# Patient Record
Sex: Female | Born: 1980 | Race: Black or African American | Hispanic: No | Marital: Single | Smoking: Former smoker
Health system: Southern US, Community
[De-identification: ages and names within clinical notes are randomized; demographics above are authoritative.]

## PROBLEM LIST (undated history)

## (undated) ENCOUNTER — Inpatient Hospital Stay (HOSPITAL_COMMUNITY): Payer: Self-pay

## (undated) DIAGNOSIS — Z789 Other specified health status: Secondary | ICD-10-CM

## (undated) DIAGNOSIS — F419 Anxiety disorder, unspecified: Secondary | ICD-10-CM

## (undated) DIAGNOSIS — B9689 Other specified bacterial agents as the cause of diseases classified elsewhere: Secondary | ICD-10-CM

## (undated) DIAGNOSIS — N760 Acute vaginitis: Secondary | ICD-10-CM

## (undated) DIAGNOSIS — Z3490 Encounter for supervision of normal pregnancy, unspecified, unspecified trimester: Secondary | ICD-10-CM

## (undated) HISTORY — PX: CHOLECYSTECTOMY: SHX55

## (undated) HISTORY — PX: NO PAST SURGERIES: SHX2092

---

## 2000-09-13 ENCOUNTER — Encounter: Payer: Self-pay | Admitting: Emergency Medicine

## 2000-09-13 ENCOUNTER — Emergency Department (HOSPITAL_COMMUNITY): Admission: EM | Admit: 2000-09-13 | Discharge: 2000-09-14 | Payer: Self-pay | Admitting: Emergency Medicine

## 2012-07-15 ENCOUNTER — Emergency Department (HOSPITAL_BASED_OUTPATIENT_CLINIC_OR_DEPARTMENT_OTHER): Payer: Self-pay

## 2012-07-15 ENCOUNTER — Emergency Department (HOSPITAL_BASED_OUTPATIENT_CLINIC_OR_DEPARTMENT_OTHER)
Admission: EM | Admit: 2012-07-15 | Discharge: 2012-07-15 | Disposition: A | Discharge status: Home / Self Care | Payer: Self-pay | Attending: Emergency Medicine | Admitting: Emergency Medicine

## 2012-07-15 ENCOUNTER — Encounter (HOSPITAL_BASED_OUTPATIENT_CLINIC_OR_DEPARTMENT_OTHER): Payer: Self-pay | Admitting: Emergency Medicine

## 2012-07-15 DIAGNOSIS — O200 Threatened abortion: Secondary | ICD-10-CM | POA: Insufficient documentation

## 2012-07-15 LAB — CBC
HCT: 32.9 % — ABNORMAL LOW (ref 36.0–46.0)
Hemoglobin: 11.3 g/dL — ABNORMAL LOW (ref 12.0–15.0)
MCH: 31 pg (ref 26.0–34.0)
MCHC: 34.3 g/dL (ref 30.0–36.0)
MCV: 90.4 fL (ref 78.0–100.0)
Platelets: 224 10*3/uL (ref 150–400)
RBC: 3.64 MIL/uL — ABNORMAL LOW (ref 3.87–5.11)
RDW: 11.7 % (ref 11.5–15.5)
WBC: 4.7 10*3/uL (ref 4.0–10.5)

## 2012-07-15 LAB — URINALYSIS, ROUTINE W REFLEX MICROSCOPIC
Hgb urine dipstick: NEGATIVE
Nitrite: NEGATIVE
Protein, ur: NEGATIVE mg/dL
Specific Gravity, Urine: 1.007 (ref 1.005–1.030)
Urobilinogen, UA: 0.2 mg/dL (ref 0.0–1.0)

## 2012-07-15 LAB — BASIC METABOLIC PANEL
Calcium: 9.5 mg/dL (ref 8.4–10.5)
GFR calc Af Amer: >90 mL/min (ref >90)
GFR calc non Af Amer: >90 mL/min (ref >90)
Glucose, Bld: 98 mg/dL (ref 70–99)
Sodium: 138 mEq/L (ref 135–145)

## 2012-07-15 LAB — PREGNANCY, URINE: Preg Test, Ur: POSITIVE — AB

## 2012-07-15 LAB — HCG, QUANTITATIVE, PREGNANCY: hCG, Beta Chain, Quant, S: 486 m[IU]/mL — ABNORMAL HIGH (ref <5)

## 2012-07-15 MED ORDER — RHO D IMMUNE GLOBULIN 1500 UNIT/2ML IJ SOLN
300.0000 ug | Freq: Once | INTRAMUSCULAR | Status: AC
  Administered 2012-07-15: 300 ug via INTRAMUSCULAR

## 2012-07-15 MED ORDER — RHO D IMMUNE GLOBULIN 1500 UNIT/2ML IJ SOLN
INTRAMUSCULAR | Status: AC
  Administered 2012-07-15: 300 ug via INTRAMUSCULAR
  Filled 2012-07-15: qty 2

## 2012-07-15 NOTE — ED Notes (Signed)
I placed a call for consult to GYN teaching service at [facility] per [place], NP, the call was connected to [facility] directly.

## 2012-07-15 NOTE — ED Provider Notes (Signed)
History/physical exam/procedure(s) were performed by non-physician practitioner and as supervising physician I was immediately available for consultation/collaboration. I have reviewed all notes and am in agreement with care and plan.   Eragon Hammond S Edynn Gillock, MD 07/15/12 2342 

## 2012-07-15 NOTE — ED Provider Notes (Signed)
History     CSN: [id number]  Arrival date & time 07/15/12  4098   First MD Initiated Contact with Patient 07/15/12 1842      Chief Complaint  Patient presents with  . Vaginal Bleeding    (Consider location/radiation/quality/duration/timing/severity/associated sxs/prior treatment) HPI Comments: Pt had a positive pregnancy test [facility] the [facility] this week:pt g2 par1  Patient is a 31 y.o. female presenting with vaginal bleeding. The history is provided by the patient. No language interpreter was used.  Vaginal Bleeding This is a new problem. The current episode started yesterday. The problem occurs constantly. The problem has been unchanged. Associated symptoms include abdominal pain. Pertinent negatives include no nausea or vomiting. Nothing aggravates the symptoms. She has tried nothing for the symptoms.    No past medical history on file.  No past surgical history on file.  No family history on file.  History  Substance Use Topics  . Smoking status: Not on file  . Smokeless tobacco: Not on file  . Alcohol Use: Not on file    OB History    Grav Para Term Preterm Abortions TAB SAB Ect Mult Living                  Review of Systems  Constitutional: Negative.   Respiratory: Negative.   Cardiovascular: Negative.   Gastrointestinal: Positive for abdominal pain. Negative for nausea and vomiting.  Genitourinary: Positive for vaginal bleeding.    Allergies  Review of patient's allergies indicates no known allergies.  Home Medications   Current Outpatient Rx  Name Route Sig Dispense Refill  . CEPHALEXIN 500 MG PO CAPS Oral Take 500 mg by mouth 4 (four) times daily.    Marland Kitchen METRONIDAZOLE 500 MG PO TABS Oral Take 500 mg by mouth 2 (two) times daily.    Marland Kitchen PRENATAL MULTIVITAMIN CH Oral Take 1 tablet by mouth daily.      BP 120/66  Pulse 110  Temp 98.9 F (37.2 C) (Oral)  Resp 16  SpO2 100%  LMP 07/01/2012  Physical Exam  Nursing note and vitals  reviewed. Constitutional: She is oriented to person, place, and time. She appears well-developed and well-nourished.  HENT:  Head: Normocephalic and atraumatic.  Neck: Neck supple.  Cardiovascular: Normal rate and regular rhythm.   Pulmonary/Chest: Effort normal and breath sounds normal.  Abdominal: Soft. Bowel sounds are normal. There is tenderness in the left lower quadrant.  Genitourinary:       Brown vaginal discharge:os is closed  Musculoskeletal: Normal range of motion.  Neurological: She is alert and oriented to person, place, and time.  Skin: Skin is warm and dry.    ED Course  Procedures (including critical care time)  Labs Reviewed  HCG, QUANTITATIVE, PREGNANCY - Abnormal; Notable for the following:    hCG, Beta Chain, Quant, S 486 (*)     All other components within normal limits  CBC - Abnormal; Notable for the following:    RBC 3.64 (*)     Hemoglobin 11.3 (*)     HCT 32.9 (*)     All other components within normal limits  PREGNANCY, URINE - Abnormal; Notable for the following:    Preg Test, Ur POSITIVE (*)     All other components within normal limits  ABO/RH  URINALYSIS, ROUTINE W REFLEX MICROSCOPIC  BASIC METABOLIC PANEL   US Ob Comp Less 14 Wks  07/15/2012  *RADIOLOGY REPORT*  Clinical Data: Vaginal bleeding.  Positive pregnancy test.  OBSTETRIC <14  WK [place] AND TRANSVAGINAL OB US  Technique:  Both transabdominal and transvaginal ultrasound examinations were performed for complete evaluation of the gestation as well as the maternal uterus, adnexal regions, and pelvic cul-de-sac.  Transvaginal technique was performed to assess early pregnancy.  Comparison:  None.  Intrauterine gestational sac:  None. Yolk sac: Not present. Embryo: Not present. Cardiac Activity: Not present. Heart Rate: Not applicable bpm  Maternal uterus/adnexae: Right ovarian cyst is present measuring 33 mm x 21 mm x 25 mm. This appears to be located within the ovary and ovarian cyst is favored over a  paraovarian cyst.  Left ovary appears normal.  No adnexal mass.  Small amount of free fluid is present in the anatomic pelvis which may be physiologic.  IMPRESSION: There is no intrauterine pregnancy identified.  The differential considerations include intrauterine gestation too early to be visualized, spontaneous abortion or ectopic.  Consider follow-up ultrasound in 14 days and serial quantitative beta HCGs.  Original Report Authenticated By: Andreas Newport, M.D.   US Ob Transvaginal  07/15/2012  *RADIOLOGY REPORT*  Clinical Data: Vaginal bleeding.  Positive pregnancy test.  OBSTETRIC <14 WK [place] AND TRANSVAGINAL OB US  Technique:  Both transabdominal and transvaginal ultrasound examinations were performed for complete evaluation of the gestation as well as the maternal uterus, adnexal regions, and pelvic cul-de-sac.  Transvaginal technique was performed to assess early pregnancy.  Comparison:  None.  Intrauterine gestational sac:  None. Yolk sac: Not present. Embryo: Not present. Cardiac Activity: Not present. Heart Rate: Not applicable bpm  Maternal uterus/adnexae: Right ovarian cyst is present measuring 33 mm x 21 mm x 25 mm. This appears to be located within the ovary and ovarian cyst is favored over a paraovarian cyst.  Left ovary appears normal.  No adnexal mass.  Small amount of free fluid is present in the anatomic pelvis which may be physiologic.  IMPRESSION: There is no intrauterine pregnancy identified.  The differential considerations include intrauterine gestation too early to be visualized, spontaneous abortion or ectopic.  Consider follow-up ultrasound in 14 days and serial quantitative beta HCGs.  Original Report Authenticated By: Andreas Newport, M.D.     1. Threatened miscarriage       MDM  Pt given rhogam as spoke with [facility] and because lab was unsure of typing related to the anti d antigen they said to give rhogam:discussed findings with pt and that to early to say what is going  to happen and pt is to go follow up in the [place] in 2 day        Teressa Lower, NP 07/15/12 2303

## 2012-07-15 NOTE — ED Notes (Signed)
Pt states she is pregnant but is now having some vaginal bleeding.

## 2012-07-17 LAB — ABO/RH
Antibody Screen: NEGATIVE
Weak D: POSITIVE

## 2013-08-14 ENCOUNTER — Encounter (HOSPITAL_BASED_OUTPATIENT_CLINIC_OR_DEPARTMENT_OTHER): Payer: Self-pay | Admitting: Emergency Medicine

## 2013-08-14 ENCOUNTER — Emergency Department (HOSPITAL_BASED_OUTPATIENT_CLINIC_OR_DEPARTMENT_OTHER)
Admission: EM | Admit: 2013-08-14 | Discharge: 2013-08-14 | Disposition: A | Discharge status: Home / Self Care | Payer: Self-pay | Attending: Emergency Medicine | Admitting: Emergency Medicine

## 2013-08-14 DIAGNOSIS — N390 Urinary tract infection, site not specified: Secondary | ICD-10-CM | POA: Insufficient documentation

## 2013-08-14 DIAGNOSIS — N760 Acute vaginitis: Secondary | ICD-10-CM | POA: Insufficient documentation

## 2013-08-14 DIAGNOSIS — F17200 Nicotine dependence, unspecified, uncomplicated: Secondary | ICD-10-CM | POA: Insufficient documentation

## 2013-08-14 DIAGNOSIS — Z3202 Encounter for pregnancy test, result negative: Secondary | ICD-10-CM | POA: Insufficient documentation

## 2013-08-14 DIAGNOSIS — B9689 Other specified bacterial agents as the cause of diseases classified elsewhere: Secondary | ICD-10-CM

## 2013-08-14 LAB — URINALYSIS, ROUTINE W REFLEX MICROSCOPIC
Glucose, UA: NEGATIVE mg/dL
Hgb urine dipstick: NEGATIVE
Ketones, ur: NEGATIVE mg/dL
Protein, ur: NEGATIVE mg/dL
Urobilinogen, UA: 1 mg/dL (ref 0.0–1.0)

## 2013-08-14 LAB — WET PREP, GENITAL: Trich, Wet Prep: NONE SEEN

## 2013-08-14 LAB — URINE MICROSCOPIC-ADD ON

## 2013-08-14 MED ORDER — METRONIDAZOLE 500 MG PO TABS: 500.0000 mg | ORAL_TABLET | Freq: Two times a day (BID) | ORAL | Status: DC

## 2013-08-14 MED ORDER — CEPHALEXIN 500 MG PO CAPS: 500.0000 mg | ORAL_CAPSULE | Freq: Four times a day (QID) | ORAL | Status: DC

## 2013-08-14 NOTE — ED Notes (Signed)
Pt reports lower abdominal pain for 2 weeks, started having pain in lower back today, + frequent urination, foul smelling urine and vaginal discharge

## 2013-08-14 NOTE — ED Provider Notes (Signed)
CSN: [id number]     Arrival date & time 08/14/13  0019 History   First MD Initiated Contact with Patient 08/14/13 0030     Chief Complaint  Patient presents with  . Abdominal Pain   (Consider location/radiation/quality/duration/timing/severity/associated sxs/prior Treatment) HPI Comments: 32 year old female presents with abdominal pain, frequent urination, dysuria and vaginal discharge. She states she's had a foul vaginal odor for about one month. The abdominal pain has been intermittent cramping and sharp pain for about 2 weeks. She's had BV before and states that her discharge is similar to this. She does not think she has an STD this time. She just got off birth control and does not think she's pregnant. She is not currently having any pain. She states that she's not have any itching or vaginal bleeding. Denies any fevers  The history is provided by the patient.    History reviewed. No pertinent past medical history. History reviewed. No pertinent past surgical history. History reviewed. No pertinent family history. History  Substance Use Topics  . Smoking status: Current Some Day Smoker    Types: Cigarettes  . Smokeless tobacco: Not on file  . Alcohol Use: Yes     Comment: ocassionally   OB History   Grav Para Term Preterm Abortions TAB SAB Ect Mult Living                 Review of Systems  Constitutional: Negative for fever and chills.  Gastrointestinal: Positive for abdominal pain. Negative for vomiting.  Genitourinary: Positive for dysuria, frequency and vaginal discharge. Negative for vaginal pain and menstrual problem.  Musculoskeletal: Positive for back pain.  All other systems reviewed and are negative.    Allergies  Review of patient's allergies indicates no known allergies.  Home Medications   Current Outpatient Rx  Name  Route  Sig  Dispense  Refill  . cephALEXin (KEFLEX) 500 MG capsule   Oral   Take 500 mg by mouth 4 (four) times daily.         .  metroNIDAZOLE (FLAGYL) 500 MG tablet   Oral   Take 500 mg by mouth 2 (two) times daily.         . Prenatal Vit-Fe Fumarate-FA (PRENATAL MULTIVITAMIN) TABS   Oral   Take 1 tablet by mouth daily.          BP 131/84  Pulse 75  Temp(Src) 98.6 F (37 C) (Oral)  Resp 18  Ht 5\' 5"  (1.651 m)  Wt 140 lb (63.504 kg)  BMI 23.3 kg/m2  SpO2 100% Physical Exam  Nursing note and vitals reviewed. Constitutional: She is oriented to person, place, and time. She appears well-developed and well-nourished.  HENT:  Head: Normocephalic and atraumatic.  Right Ear: External ear normal.  Left Ear: External ear normal.  Nose: Nose normal.  Eyes: Right eye exhibits no discharge. Left eye exhibits no discharge.  Cardiovascular: Normal rate, regular rhythm and normal heart sounds.   Pulmonary/Chest: Effort normal and breath sounds normal.  Abdominal: Soft. She exhibits no distension. There is no tenderness. There is no CVA tenderness.  Genitourinary: Uterus normal. Uterus is not enlarged and not tender. Cervix exhibits discharge. Cervix exhibits no motion tenderness and no friability. Right adnexum displays no tenderness. Left adnexum displays no tenderness. No tenderness or bleeding around the vagina. Vaginal discharge found.  Neurological: She is alert and oriented to person, place, and time.  Skin: Skin is warm and dry.    ED Course  Procedures (including critical  care time) Labs Review Labs Reviewed  WET PREP, GENITAL - Abnormal; Notable for the following:    Clue Cells Wet Prep HPF POC MANY (*)    WBC, Wet Prep HPF POC MODERATE (*)    All other components within normal limits  URINALYSIS, ROUTINE W REFLEX MICROSCOPIC - Abnormal; Notable for the following:    APPearance CLOUDY (*)    Leukocytes, UA SMALL (*)    All other components within normal limits  URINE MICROSCOPIC-ADD ON - Abnormal; Notable for the following:    Squamous Epithelial / LPF MANY (*)    Bacteria, UA MANY (*)    All  other components within normal limits  GC/CHLAMYDIA PROBE AMP  URINE CULTURE  PREGNANCY, URINE   Imaging Review No results found.  MDM   1. UTI (urinary tract infection)   2. BV (bacterial vaginosis)    Patient is well appearing, and has a benign abdominal exam. No tenderness noted. Doubt any acute surgical process. Given her symptoms and the wet prep I will treat for BV. Her urine is dirty catch but given her symptoms will treat her for UTI as well. Does not appear systemically ill and is stable for outpatient treatment.    Audree Camel, MD 08/14/13 [phone]

## 2013-08-15 LAB — URINE CULTURE

## 2014-04-15 ENCOUNTER — Emergency Department (HOSPITAL_BASED_OUTPATIENT_CLINIC_OR_DEPARTMENT_OTHER)
Admission: EM | Admit: 2014-04-15 | Discharge: 2014-04-15 | Disposition: A | Discharge status: Home / Self Care | Payer: Medicaid Other | Attending: Emergency Medicine | Admitting: Emergency Medicine

## 2014-04-15 ENCOUNTER — Encounter (HOSPITAL_BASED_OUTPATIENT_CLINIC_OR_DEPARTMENT_OTHER): Payer: Self-pay | Admitting: Emergency Medicine

## 2014-04-15 DIAGNOSIS — A5901 Trichomonal vulvovaginitis: Secondary | ICD-10-CM | POA: Insufficient documentation

## 2014-04-15 DIAGNOSIS — F17200 Nicotine dependence, unspecified, uncomplicated: Secondary | ICD-10-CM | POA: Insufficient documentation

## 2014-04-15 DIAGNOSIS — B9689 Other specified bacterial agents as the cause of diseases classified elsewhere: Secondary | ICD-10-CM | POA: Insufficient documentation

## 2014-04-15 DIAGNOSIS — Z792 Long term (current) use of antibiotics: Secondary | ICD-10-CM | POA: Insufficient documentation

## 2014-04-15 DIAGNOSIS — A599 Trichomoniasis, unspecified: Secondary | ICD-10-CM

## 2014-04-15 DIAGNOSIS — A499 Bacterial infection, unspecified: Secondary | ICD-10-CM | POA: Insufficient documentation

## 2014-04-15 DIAGNOSIS — N760 Acute vaginitis: Secondary | ICD-10-CM | POA: Insufficient documentation

## 2014-04-15 DIAGNOSIS — Z3202 Encounter for pregnancy test, result negative: Secondary | ICD-10-CM | POA: Insufficient documentation

## 2014-04-15 DIAGNOSIS — Z79899 Other long term (current) drug therapy: Secondary | ICD-10-CM | POA: Insufficient documentation

## 2014-04-15 LAB — URINALYSIS, ROUTINE W REFLEX MICROSCOPIC
BILIRUBIN URINE: NEGATIVE
GLUCOSE, UA: NEGATIVE mg/dL
HGB URINE DIPSTICK: NEGATIVE
KETONES UR: NEGATIVE mg/dL
Nitrite: NEGATIVE
PH: 6 (ref 5.0–8.0)
PROTEIN: NEGATIVE mg/dL
Specific Gravity, Urine: 1.02 (ref 1.005–1.030)
Urobilinogen, UA: 1 mg/dL (ref 0.0–1.0)

## 2014-04-15 LAB — URINE MICROSCOPIC-ADD ON

## 2014-04-15 LAB — PREGNANCY, URINE: Preg Test, Ur: NEGATIVE

## 2014-04-15 LAB — WET PREP, GENITAL: Yeast Wet Prep HPF POC: NONE SEEN

## 2014-04-15 MED ORDER — METRONIDAZOLE 500 MG PO TABS: 500.0000 mg | ORAL_TABLET | Freq: Two times a day (BID) | ORAL | Status: DC

## 2014-04-15 MED ORDER — AZITHROMYCIN 250 MG PO TABS
1000.0000 mg | ORAL_TABLET | Freq: Once | ORAL | Status: AC
  Administered 2014-04-15: 1000 mg via ORAL
  Filled 2014-04-15: qty 4

## 2014-04-15 MED ORDER — CEFTRIAXONE SODIUM 250 MG IJ SOLR
250.0000 mg | Freq: Once | INTRAMUSCULAR | Status: AC
  Administered 2014-04-15: 250 mg via INTRAMUSCULAR
  Filled 2014-04-15: qty 250

## 2014-04-15 NOTE — ED Notes (Signed)
Pt stated having abdominal pain and vaginal discharge with odor x1week

## 2014-04-15 NOTE — Discharge Instructions (Signed)
Flagyl as prescribed.  We will call you if your cultures indicate you require further treatment.  Return to the emergency department if you develop worsening or new and concerning symptoms.   Bacterial Vaginosis Bacterial vaginosis is a vaginal infection that occurs when the normal balance of bacteria in the vagina is disrupted. It results from an overgrowth of certain bacteria. This is the most common vaginal infection in women of childbearing age. Treatment is important to prevent complications, especially in pregnant women, as it can cause a premature delivery. CAUSES  Bacterial vaginosis is caused by an increase in harmful bacteria that are normally present in smaller amounts in the vagina. Several different kinds of bacteria can cause bacterial vaginosis. However, the reason that the condition develops is not fully understood. RISK FACTORS Certain activities or behaviors can put you at an increased risk of developing bacterial vaginosis, including:  Having a new sex partner or multiple sex partners.  Douching.  Using an intrauterine device (IUD) for contraception. Women do not get bacterial vaginosis from toilet seats, bedding, swimming pools, or contact with objects around them. SIGNS AND SYMPTOMS  Some women with bacterial vaginosis have no signs or symptoms. Common symptoms include:  Grey vaginal discharge.  A fishlike odor with discharge, especially after sexual intercourse.  Itching or burning of the vagina and vulva.  Burning or pain with urination. DIAGNOSIS  Your health care provider will take a medical history and examine the vagina for signs of bacterial vaginosis. A sample of vaginal fluid may be taken. Your health care provider will look at this sample under a microscope to check for bacteria and abnormal cells. A vaginal pH test may also be done.  TREATMENT  Bacterial vaginosis may be treated with antibiotic medicines. These may be given in the form of a pill or a  vaginal cream. A second round of antibiotics may be prescribed if the condition comes back after treatment.  HOME CARE INSTRUCTIONS   Only take over-the-counter or prescription medicines as directed by your health care provider.  If antibiotic medicine was prescribed, take it as directed. Make sure you finish it even if you start to feel better.  Do not have sex until treatment is completed.  Tell all sexual partners that you have a vaginal infection. They should see their health care provider and be treated if they have problems, such as a mild rash or itching.  Practice safe sex by using condoms and only having one sex partner. SEEK MEDICAL CARE IF:   Your symptoms are not improving after 3 days of treatment.  You have increased discharge or pain.  You have a fever. MAKE SURE YOU:   Understand these instructions.  Will watch your condition.  Will get help right away if you are not doing well or get worse. FOR MORE INFORMATION  Centers for Disease Control and Prevention, Division of STD Prevention: SolutionApps.co.zawww.cdc.gov/std American Sexual Health Association (ASHA): www.ashastd.org  Document Released: 11/28/2005 Document Revised: 09/18/2013 Document Reviewed: 07/10/2013 [facility]Exit[facility] Patient Information 2014 [place]Care, [place].  Trichomoniasis Trichomoniasis is an infection, caused by the Trichomonas organism, that affects both women and men. In women, the outer female genitalia and the vagina are affected. In men, the penis is mainly affected, but the prostate and other reproductive organs can also be involved. Trichomoniasis is a sexually transmitted disease (STD) and is most often passed to another person through sexual contact. The majority of people who get trichomoniasis do so from a sexual encounter and are also at  risk for other STDs. CAUSES   Sexual intercourse with an infected partner.  It can be present in swimming pools or hot tubs. SYMPTOMS   Abnormal gray-green frothy vaginal  discharge in women.  Vaginal itching and irritation in women.  Itching and irritation of the area outside the vagina in women.  Penile discharge with or without pain in males.  Inflammation of the urethra (urethritis), causing painful urination.  Bleeding after sexual intercourse. RELATED COMPLICATIONS  Pelvic inflammatory disease.  Infection of the uterus (endometritis).  Infertility.  Tubal (ectopic) pregnancy.  It can be associated with other STDs, including gonorrhea and chlamydia, hepatitis B, and HIV. COMPLICATIONS DURING PREGNANCY  Early (premature) delivery.  Premature rupture of the membranes (PROM).  Low birth weight. DIAGNOSIS   Visualization of Trichomonas under the microscope from the vagina discharge.  Ph of the vagina greater than 4.5, tested with a test tape.  Trich Rapid Test.  Culture of the organism, but this is not usually needed.  It may be found on a Pap test.  Having a "strawberry cervix,"which means the cervix looks very red like a strawberry. TREATMENT   You may be given medication to fight the infection. Inform your caregiver if you could be or are pregnant. Some medications used to treat the infection should not be taken during pregnancy.  Over-the-counter medications or creams to decrease itching or irritation may be recommended.  Your sexual partner will need to be treated if infected. HOME CARE INSTRUCTIONS   Take all medication prescribed by your caregiver.  Take over-the-counter medication for itching or irritation as directed by your caregiver.  Do not have sexual intercourse while you have the infection.  Do not douche or wear tampons.  Discuss your infection with your partner, as your partner may have acquired the infection from you. Or, your partner may have been the person who transmitted the infection to you.  Have your sex partner examined and treated if necessary.  Practice safe, informed, and protected sex.  See  your caregiver for other STD testing. SEEK MEDICAL CARE IF:   You still have symptoms after you finish the medication.  You have an oral temperature above 102 F (38.9 C).  You develop belly (abdominal) pain.  You have pain when you urinate.  You have bleeding after sexual intercourse.  You develop a rash.  The medication makes you sick or makes you throw up (vomit). Document Released: 05/24/2001 Document Revised: 02/20/2012 Document Reviewed: 06/19/2009 [facility] DivisionExitCare Patient Information 2014 [place]Care, [place]LL[place].

## 2014-04-15 NOTE — ED Provider Notes (Signed)
CSN: [id number]     Arrival date & time 04/15/14  1054 History   First MD Initiated Contact with Patient 04/15/14 1147     Chief Complaint  Patient presents with  . Abdominal Pain  . Vaginal Discharge     (Consider location/radiation/quality/duration/timing/severity/associated sxs/prior Treatment) HPI Comments: Patient is a 33 year old female who presents with complaints of vaginal discharge and suprapubic discomfort. This is been going on for approximately one week and worsening. She states that she found out her boyfriend has been having relations with someone else. She just wants to be "checked out". She is uncertain of her last menstrual period as she gets a Depo shot.  Patient is a 33 y.o. female presenting with abdominal pain and vaginal discharge. The history is provided by the patient.  Abdominal Pain Pain location:  Suprapubic Pain radiates to:  Does not radiate Pain severity:  Mild Onset quality:  Gradual Duration:  1 week Timing:  Constant Progression:  Worsening Chronicity:  New Associated symptoms: vaginal discharge   Vaginal Discharge Associated symptoms: abdominal pain     History reviewed. No pertinent past medical history. History reviewed. No pertinent past surgical history. No family history on file. History  Substance Use Topics  . Smoking status: Current Some Day Smoker    Types: Cigarettes  . Smokeless tobacco: Not on file  . Alcohol Use: Yes     Comment: ocassionally   OB History   Grav Para Term Preterm Abortions TAB SAB Ect Mult Living                 Review of Systems  Gastrointestinal: Positive for abdominal pain.  Genitourinary: Positive for vaginal discharge.  All other systems reviewed and are negative.     Allergies  Review of patient's allergies indicates no known allergies.  Home Medications   Prior to Admission medications   Medication Sig Start Date End Date Taking? Authorizing Provider  cephALEXin (KEFLEX) 500 MG capsule  Take 1 capsule (500 mg total) by mouth 4 (four) times daily. 08/14/13   Audree CamelScott T Goldston, MD  metroNIDAZOLE (FLAGYL) 500 MG tablet Take 1 tablet (500 mg total) by mouth 2 (two) times daily. One po bid x 7 days 08/14/13   Audree CamelScott T Goldston, MD  Prenatal Vit-Fe Fumarate-FA (PRENATAL MULTIVITAMIN) TABS Take 1 tablet by mouth daily.    Historical Provider, MD   BP 136/90  Pulse 81  Temp(Src) 98.3 F (36.8 C) (Oral)  Resp 18  Ht 5\' 5"  (1.651 m)  Wt 135 lb (61.236 kg)  BMI 22.47 kg/m2  SpO2 100%  LMP 02/01/2014 Physical Exam  Nursing note and vitals reviewed. Constitutional: She is oriented to person, place, and time. She appears well-developed and well-nourished. No distress.  HENT:  Head: Normocephalic and atraumatic.  Neck: Normal range of motion. Neck supple.  Genitourinary: Uterus normal. Vaginal discharge found.  There is a yellow whitish discharge present. There are no adnexal masses or tenderness and no cervical motion tenderness.  Musculoskeletal: Normal range of motion. She exhibits no edema.  Neurological: She is alert and oriented to person, place, and time.  Skin: Skin is warm and dry. She is not diaphoretic.    ED Course  Procedures (including critical care time) Labs Review Labs Reviewed  URINALYSIS, ROUTINE W REFLEX MICROSCOPIC - Abnormal; Notable for the following:    APPearance CLOUDY (*)    Leukocytes, UA SMALL (*)    All other components within normal limits  URINE MICROSCOPIC-ADD ON - Abnormal; Notable  for the following:    Squamous Epithelial / LPF MANY (*)    Bacteria, UA FEW (*)    All other components within normal limits  PREGNANCY, URINE    Imaging Review No results found.   EKG Interpretation None      MDM   Final diagnoses:  None    Wet prep reveals many white cells and too numerous to count trichomoniasis and clue cells. I will treat with IM Rocephin and by mouth Zithromax to cover for GC and Chlamydia. She'll also be treated with Flagyl  twice daily for the BV and Trichomonas.   Geoffery Lyonsouglas Brizeyda Holtmeyer, MD 04/15/14 [phone]

## 2014-04-16 LAB — GC/CHLAMYDIA PROBE AMP
CT PROBE, AMP APTIMA: POSITIVE — AB
GC PROBE AMP APTIMA: NEGATIVE

## 2014-04-19 ENCOUNTER — Telehealth (HOSPITAL_BASED_OUTPATIENT_CLINIC_OR_DEPARTMENT_OTHER): Payer: Self-pay | Admitting: Emergency Medicine

## 2014-04-19 NOTE — Telephone Encounter (Signed)
+   Chlamyida. Tx'd with Rocephin and Zithromax in ED. Pt notified by phone after ID verified x three. STD instructions provided, patient verbalized understanding.

## 2014-08-17 ENCOUNTER — Emergency Department (HOSPITAL_COMMUNITY): Payer: Medicaid Other

## 2014-08-17 ENCOUNTER — Emergency Department (HOSPITAL_COMMUNITY)
Admission: EM | Admit: 2014-08-17 | Discharge: 2014-08-18 | Disposition: A | Discharge status: Home / Self Care | Payer: Medicaid Other | Attending: Emergency Medicine | Admitting: Emergency Medicine

## 2014-08-17 DIAGNOSIS — F17200 Nicotine dependence, unspecified, uncomplicated: Secondary | ICD-10-CM | POA: Insufficient documentation

## 2014-08-17 DIAGNOSIS — M25531 Pain in right wrist: Secondary | ICD-10-CM

## 2014-08-17 DIAGNOSIS — S59919A Unspecified injury of unspecified forearm, initial encounter: Principal | ICD-10-CM

## 2014-08-17 DIAGNOSIS — S6990XA Unspecified injury of unspecified wrist, hand and finger(s), initial encounter: Principal | ICD-10-CM

## 2014-08-17 DIAGNOSIS — S59909A Unspecified injury of unspecified elbow, initial encounter: Secondary | ICD-10-CM | POA: Insufficient documentation

## 2014-08-17 MED ORDER — HYDROCODONE-ACETAMINOPHEN 5-325 MG PO TABS
1.0000 | ORAL_TABLET | Freq: Once | ORAL | Status: AC
  Administered 2014-08-17: 1 via ORAL
  Filled 2014-08-17: qty 1

## 2014-08-17 MED ORDER — HYDROCODONE-ACETAMINOPHEN 5-325 MG PO TABS: 1.0000 | ORAL_TABLET | Freq: Four times a day (QID) | ORAL | Status: DC | PRN

## 2014-08-17 MED ORDER — NAPROXEN 500 MG PO TABS
500.0000 mg | ORAL_TABLET | Freq: Two times a day (BID) | ORAL | Status: DC
Start: 2014-08-17 — End: 2014-10-30

## 2014-08-17 NOTE — ED Notes (Signed)
Ice pack applied in triage.

## 2014-08-17 NOTE — Discharge Instructions (Signed)
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
## 2014-08-18 NOTE — ED Notes (Signed)
Pt left prior to d/c vitals. Pt in NAD at time of d/c. Respirations were equal and unlabored. Pt ambulated independently.

## 2014-08-19 ENCOUNTER — Inpatient Hospital Stay (HOSPITAL_COMMUNITY)
Admission: AD | Admit: 2014-08-19 | Discharge: 2014-08-20 | Disposition: A | Discharge status: Home / Self Care | Payer: Medicaid Other | Source: Ambulatory Visit | Attending: Family Medicine | Admitting: Family Medicine

## 2014-08-19 DIAGNOSIS — R300 Dysuria: Secondary | ICD-10-CM | POA: Diagnosis present

## 2014-08-19 DIAGNOSIS — Z5320 Procedure and treatment not carried out because of patient's decision for unspecified reasons: Secondary | ICD-10-CM | POA: Diagnosis not present

## 2014-08-19 LAB — URINALYSIS, ROUTINE W REFLEX MICROSCOPIC
Bilirubin Urine: NEGATIVE
GLUCOSE, UA: NEGATIVE mg/dL
HGB URINE DIPSTICK: NEGATIVE
Ketones, ur: NEGATIVE mg/dL
Nitrite: NEGATIVE
PH: 7 (ref 5.0–8.0)
Protein, ur: NEGATIVE mg/dL
SPECIFIC GRAVITY, URINE: 1.015 (ref 1.005–1.030)
Urobilinogen, UA: 0.2 mg/dL (ref 0.0–1.0)

## 2014-08-19 LAB — URINE MICROSCOPIC-ADD ON

## 2014-08-19 NOTE — MAU Note (Signed)
Pt reports dysuria and frequency .  

## 2014-08-20 NOTE — MAU Note (Signed)
NOT IN LOBBY 

## 2014-08-21 LAB — URINE CULTURE
Colony Count: NO GROWTH
Culture: NO GROWTH

## 2014-08-21 NOTE — ED Provider Notes (Signed)
Medical screening examination/treatment/procedure(s) were performed by non-physician practitioner and as supervising physician I was immediately available for consultation/collaboration.   EKG Interpretation None        Nalu Troublefield, MD 08/21/14 0744 

## 2014-08-22 ENCOUNTER — Emergency Department (HOSPITAL_COMMUNITY)
Admission: EM | Admit: 2014-08-22 | Discharge: 2014-08-22 | Disposition: A | Discharge status: Home / Self Care | Payer: Medicaid Other | Attending: Emergency Medicine | Admitting: Emergency Medicine

## 2014-08-22 ENCOUNTER — Encounter (HOSPITAL_COMMUNITY): Payer: Self-pay | Admitting: Emergency Medicine

## 2014-08-22 DIAGNOSIS — Z3202 Encounter for pregnancy test, result negative: Secondary | ICD-10-CM | POA: Insufficient documentation

## 2014-08-22 DIAGNOSIS — N390 Urinary tract infection, site not specified: Secondary | ICD-10-CM | POA: Insufficient documentation

## 2014-08-22 DIAGNOSIS — R110 Nausea: Secondary | ICD-10-CM | POA: Diagnosis not present

## 2014-08-22 DIAGNOSIS — F17200 Nicotine dependence, unspecified, uncomplicated: Secondary | ICD-10-CM | POA: Insufficient documentation

## 2014-08-22 DIAGNOSIS — N898 Other specified noninflammatory disorders of vagina: Secondary | ICD-10-CM | POA: Insufficient documentation

## 2014-08-22 DIAGNOSIS — R109 Unspecified abdominal pain: Secondary | ICD-10-CM | POA: Diagnosis present

## 2014-08-22 DIAGNOSIS — A5909 Other urogenital trichomoniasis: Secondary | ICD-10-CM | POA: Diagnosis not present

## 2014-08-22 DIAGNOSIS — A599 Trichomoniasis, unspecified: Secondary | ICD-10-CM

## 2014-08-22 LAB — URINALYSIS, ROUTINE W REFLEX MICROSCOPIC
Bilirubin Urine: NEGATIVE
GLUCOSE, UA: NEGATIVE mg/dL
HGB URINE DIPSTICK: NEGATIVE
Ketones, ur: NEGATIVE mg/dL
Nitrite: NEGATIVE
PROTEIN: NEGATIVE mg/dL
SPECIFIC GRAVITY, URINE: 1.024 (ref 1.005–1.030)
Urobilinogen, UA: 0.2 mg/dL (ref 0.0–1.0)
pH: 6.5 (ref 5.0–8.0)

## 2014-08-22 LAB — WET PREP, GENITAL: Yeast Wet Prep HPF POC: NONE SEEN

## 2014-08-22 LAB — URINE MICROSCOPIC-ADD ON

## 2014-08-22 LAB — PREGNANCY, URINE: PREG TEST UR: NEGATIVE

## 2014-08-22 MED ORDER — IBUPROFEN 600 MG PO TABS: 600.0000 mg | ORAL_TABLET | Freq: Four times a day (QID) | ORAL | Status: DC | PRN

## 2014-08-22 MED ORDER — PHENAZOPYRIDINE HCL 200 MG PO TABS: 200.0000 mg | ORAL_TABLET | Freq: Three times a day (TID) | ORAL | Status: DC | PRN

## 2014-08-22 MED ORDER — NITROFURANTOIN MONOHYD MACRO 100 MG PO CAPS: 100.0000 mg | ORAL_CAPSULE | Freq: Two times a day (BID) | ORAL | Status: DC

## 2014-08-22 MED ORDER — METRONIDAZOLE 500 MG PO TABS: 500.0000 mg | ORAL_TABLET | Freq: Two times a day (BID) | ORAL | Status: DC

## 2014-08-22 NOTE — ED Notes (Signed)
Pt presents with lower abdominal pain, vaginal discharge, and painful urination for the past week.  Pt reports discharge is white in color.  Denies medications at home.

## 2014-08-22 NOTE — ED Notes (Signed)
Pt refused wheelchair.  

## 2014-08-22 NOTE — ED Provider Notes (Signed)
CSN: [id number]     Arrival date & time 08/22/14  0007 History   First MD Initiated Contact with Patient 08/22/14 0217     Chief Complaint  Patient presents with  . Abdominal Pain     (Consider location/radiation/quality/duration/timing/severity/associated sxs/prior Treatment) HPI Patient presents with one week of intermittent lower, pain. She states she's had dysuria, frequency, urgency. She's had no fever chills. She's had mild nausea without vomiting. She states that she noticed a white discharge yesterday. She is sexually active. No vaginal bleeding. History reviewed. No pertinent past medical history. History reviewed. No pertinent past surgical history. No family history on file. History  Substance Use Topics  . Smoking status: Current Some Day Smoker    Types: Cigarettes  . Smokeless tobacco: Not on file  . Alcohol Use: Yes     Comment: ocassionally   OB History   Grav Para Term Preterm Abortions TAB SAB Ect Mult Living                 Review of Systems  Constitutional: Negative for fever and chills.  Cardiovascular: Negative for chest pain.  Gastrointestinal: Positive for nausea and abdominal pain. Negative for vomiting, diarrhea, constipation and blood in stool.  Genitourinary: Positive for dysuria, frequency, vaginal discharge and pelvic pain. Negative for hematuria, flank pain and vaginal bleeding.  Musculoskeletal: Negative for back pain, myalgias, neck pain and neck stiffness.  Skin: Negative for rash and wound.  Neurological: Negative for dizziness, weakness, light-headedness, numbness and headaches.  All other systems reviewed and are negative.     Allergies  Review of patient's allergies indicates no known allergies.  Home Medications   Prior to Admission medications   Medication Sig Start Date End Date Taking? Authorizing Provider  HYDROcodone-acetaminophen (NORCO/VICODIN) 5-325 MG per tablet Take 1 tablet by mouth every 6 (six) hours as needed for  moderate pain or severe pain. 08/17/14   Courtney A Forcucci, PA-C  naproxen (NAPROSYN) 500 MG tablet Take 1 tablet (500 mg total) by mouth 2 (two) times daily. 08/17/14   Courtney A Forcucci, PA-C   BP 130/94  Pulse 73  Temp(Src) 98.1 F (36.7 C) (Oral)  Resp 18  Ht  (1.651 m)  Wt 133 lb (60.328 kg)  BMI 22.13 kg/m2  SpO2 10%  LMP 08/01/2014 Physical Exam  Nursing note and vitals reviewed. Constitutional: She is oriented to person, place, and time. She appears well-developed and well-nourished. No distress.  HENT:  Head: Normocephalic and atraumatic.  Mouth/Throat: Oropharynx is clear and moist.  Eyes: EOM are normal. Pupils are equal, round, and reactive to light.  Neck: Normal range of motion. Neck supple.  Cardiovascular: Normal rate and regular rhythm.   Pulmonary/Chest: Effort normal and breath sounds normal. No respiratory distress. She has no wheezes. She has no rales.  Abdominal: Soft. Bowel sounds are normal. She exhibits no distension and no mass. There is tenderness (mild tenderness to palpation in the suprapubic region. There is no rebound or guarding.). There is no rebound and no guarding.  Genitourinary: Vaginal discharge found.  Thick white vaginal discharge. No cervical motion tenderness. Mild right adnexal tenderness  Musculoskeletal: Normal range of motion. She exhibits no edema and no tenderness.  No CVA tenderness bilaterally.  Neurological: She is alert and oriented to person, place, and time.  Skin: Skin is warm and dry. No rash noted. No erythema.  Psychiatric: She has a normal mood and affect. Her behavior is normal.    ED Course  Procedures (  including critical care time) Labs Review Labs Reviewed  WET PREP, GENITAL - Abnormal; Notable for the following:    Trich, Wet Prep MODERATE (*)    Clue Cells Wet Prep HPF POC FEW (*)    WBC, Wet Prep HPF POC FEW (*)    All other components within normal limits  URINALYSIS, ROUTINE W REFLEX MICROSCOPIC -  Abnormal; Notable for the following:    APPearance CLOUDY (*)    Leukocytes, UA MODERATE (*)    All other components within normal limits  URINE MICROSCOPIC-ADD ON - Abnormal; Notable for the following:    Squamous Epithelial / LPF MANY (*)    Bacteria, UA FEW (*)    All other components within normal limits  GC/CHLAMYDIA PROBE AMP  PREGNANCY, URINE    Imaging Review No results found.   EKG Interpretation None      MDM   Final diagnoses:  Trichomonal infection  UTI (lower urinary tract infection)      Abdominal exam benign. We'll treat for UTI and Trichomonas infection. Patient is to have all sexual partners evaluated. Return precautions given.  Loren Racer, MD 08/22/14 [phone]

## 2014-08-22 NOTE — Discharge Instructions (Signed)
Trichomoniasis °Trichomoniasis is an infection caused by an organism called Trichomonas. The infection can affect both women and men. In women, the outer female genitalia and the vagina are affected. In men, the penis is mainly affected, but the prostate and other reproductive organs can also be involved. Trichomoniasis is a sexually transmitted infection (STI) and is most often passed to another person through sexual contact.  °RISK FACTORS °· Having unprotected sexual intercourse. °· Having sexual intercourse with an infected partner. °SIGNS AND SYMPTOMS  °Symptoms of trichomoniasis in women include: °· Abnormal gray-green frothy vaginal discharge. °· Itching and irritation of the vagina. °· Itching and irritation of the area outside the vagina. °Symptoms of trichomoniasis in men include:  °· Penile discharge with or without pain. °· Pain during urination. This results from inflammation of the urethra. °DIAGNOSIS  °Trichomoniasis may be found during a Pap test or physical exam. Your health care provider may use one of the following methods to help diagnose this infection: °· Examining vaginal discharge under a microscope. For men, urethral discharge would be examined. °· Testing the pH of the vagina with a test tape. °· Using a vaginal swab test that checks for the Trichomonas organism. A test is available that provides results within a few minutes. °· Doing a culture test for the organism. This is not usually needed. °TREATMENT  °· You may be given medicine to fight the infection. Women should inform their health care provider if they could be or are pregnant. Some medicines used to treat the infection should not be taken during pregnancy. °· Your health care provider may recommend over-the-counter medicines or creams to decrease itching or irritation. °· Your sexual partner will need to be treated if infected. °HOME CARE INSTRUCTIONS  °· Take medicines only as directed by your health care provider. °· Take  over-the-counter medicine for itching or irritation as directed by your health care provider. °· Do not have sexual intercourse while you have the infection. °· Women should not douche or wear tampons while they have the infection. °· Discuss your infection with your partner. Your partner may have gotten the infection from you, or you may have gotten it from your partner. °· Have your sex partner get examined and treated if necessary. °· Practice safe, informed, and protected sex. °· See your health care provider for other STI testing. °SEEK MEDICAL CARE IF:  °· You still have symptoms after you finish your medicine. °· You develop abdominal pain. °· You have pain when you urinate. °· You have bleeding after sexual intercourse. °· You develop a rash. °· Your medicine makes you sick or makes you throw up (vomit). °MAKE SURE YOU: °· Understand these instructions. °· Will watch your condition. °· Will get help right away if you are not doing well or get worse. °Document Released: 05/24/2001 Document Revised: 04/14/2014 Document Reviewed: 09/09/2013 °ExitCare® Patient Information ©2015 [employer]. This information is not intended to replace advice given to you by your health care provider. Make sure you discuss any questions you have with your health care provider. ° °Urinary Tract Infection °Urinary tract infections (UTIs) can develop anywhere along your urinary tract. Your urinary tract is your body's drainage system for removing wastes and extra water. Your urinary tract includes two kidneys, two ureters, a bladder, and a urethra. Your kidneys are a pair of bean-shaped organs. Each kidney is about the size of your fist. They are located below your ribs, one on each side of your spine. °CAUSES °Infections   are caused by microbes, which are microscopic organisms, including fungi, viruses, and bacteria. These organisms are so small that they can only be seen through a microscope. Bacteria are the microbes that most  commonly cause UTIs. °SYMPTOMS  °Symptoms of UTIs may vary by age and gender of the patient and by the location of the infection. Symptoms in young women typically include a frequent and intense urge to urinate and a painful, burning feeling in the bladder or urethra during urination. Older women and men are more likely to be tired, shaky, and weak and have muscle aches and abdominal pain. A fever may mean the infection is in your kidneys. Other symptoms of a kidney infection include pain in your back or sides below the ribs, nausea, and vomiting. °DIAGNOSIS °To diagnose a UTI, your caregiver will ask you about your symptoms. Your caregiver also will ask to provide a urine sample. The urine sample will be tested for bacteria and white blood cells. White blood cells are made by your body to help fight infection. °TREATMENT  °Typically, UTIs can be treated with medication. Because most UTIs are caused by a bacterial infection, they usually can be treated with the use of antibiotics. The choice of antibiotic and length of treatment depend on your symptoms and the type of bacteria causing your infection. °HOME CARE INSTRUCTIONS °· If you were prescribed antibiotics, take them exactly as your caregiver instructs you. Finish the medication even if you feel better after you have only taken some of the medication. °· Drink enough water and fluids to keep your urine clear or pale yellow. °· Avoid caffeine, tea, and carbonated beverages. They tend to irritate your bladder. °· Empty your bladder often. Avoid holding urine for long periods of time. °· Empty your bladder before and after sexual intercourse. °· After a bowel movement, women should cleanse from front to back. Use each tissue only once. °SEEK MEDICAL CARE IF:  °· You have back pain. °· You develop a fever. °· Your symptoms do not begin to resolve within 3 days. °SEEK IMMEDIATE MEDICAL CARE IF:  °· You have severe back pain or lower abdominal pain. °· You develop  chills. °· You have nausea or vomiting. °· You have continued burning or discomfort with urination. °MAKE SURE YOU:  °· Understand these instructions. °· Will watch your condition. °· Will get help right away if you are not doing well or get worse. °Document Released: 09/07/2005 Document Revised: 05/29/2012 Document Reviewed: 01/06/2012 °ExitCare® Patient Information ©2015 [employer]. This information is not intended to replace advice given to you by your health care provider. Make sure you discuss any questions you have with your health care provider. ° °

## 2014-08-23 LAB — GC/CHLAMYDIA PROBE AMP
CT PROBE, AMP APTIMA: NEGATIVE
GC PROBE AMP APTIMA: POSITIVE — AB

## 2014-08-24 ENCOUNTER — Telehealth (HOSPITAL_BASED_OUTPATIENT_CLINIC_OR_DEPARTMENT_OTHER): Payer: Self-pay | Admitting: Emergency Medicine

## 2014-08-24 NOTE — Telephone Encounter (Signed)
Positive gonorrhea culture Chart sent to EDP for review

## 2014-08-30 ENCOUNTER — Telehealth (HOSPITAL_BASED_OUTPATIENT_CLINIC_OR_DEPARTMENT_OTHER): Payer: Self-pay | Admitting: Emergency Medicine

## 2014-08-30 NOTE — Telephone Encounter (Signed)
Post ED Visit - Positive Culture Follow-up: Successful Patient Follow-Up  Culture assessed and recommendations reviewed by:  Wes Dulaney, Pharm.D., BCPS  Celedonio Miyamoto, [address].D., BCPS  Georgina Pillion, Pharm.D., BCPS  Marcus Hook, [address].D., BCPS, AAHIVP  Estella Husk, Pharm.D., BCPS, AAHIVP  Red Christians, Pharm.D.  Tennis Must, [place].D.  Positive Chlamydia culture   Patient discharged without antimicrobial prescription and treatment is now indicated  Organism is resistant to prescribed ED discharge antimicrobial  Patient with positive blood cultures  Changes discussed with ED provider: Fayrene Helper PA New antibiotic prescription Rocephin  IM  , needs to return to receive injection Called to n/a  Contacted patient, date 08/30/2014, time 1030   Berle Mull 08/30/2014, 2:39 PM

## 2014-10-30 ENCOUNTER — Emergency Department (HOSPITAL_BASED_OUTPATIENT_CLINIC_OR_DEPARTMENT_OTHER)
Admission: EM | Admit: 2014-10-30 | Discharge: 2014-10-31 | Disposition: A | Discharge status: Home / Self Care | Payer: Medicaid Other | Attending: Emergency Medicine | Admitting: Emergency Medicine

## 2014-10-30 ENCOUNTER — Encounter (HOSPITAL_BASED_OUTPATIENT_CLINIC_OR_DEPARTMENT_OTHER): Payer: Self-pay | Admitting: Emergency Medicine

## 2014-10-30 DIAGNOSIS — N760 Acute vaginitis: Secondary | ICD-10-CM

## 2014-10-30 DIAGNOSIS — Z720 Tobacco use: Secondary | ICD-10-CM | POA: Insufficient documentation

## 2014-10-30 DIAGNOSIS — Z3202 Encounter for pregnancy test, result negative: Secondary | ICD-10-CM | POA: Insufficient documentation

## 2014-10-30 DIAGNOSIS — N898 Other specified noninflammatory disorders of vagina: Secondary | ICD-10-CM | POA: Diagnosis present

## 2014-10-30 DIAGNOSIS — R1030 Lower abdominal pain, unspecified: Secondary | ICD-10-CM

## 2014-10-30 LAB — URINALYSIS, ROUTINE W REFLEX MICROSCOPIC
BILIRUBIN URINE: NEGATIVE
Glucose, UA: NEGATIVE mg/dL
KETONES UR: NEGATIVE mg/dL
LEUKOCYTES UA: NEGATIVE
NITRITE: NEGATIVE
Protein, ur: NEGATIVE mg/dL
Specific Gravity, Urine: 1.028 (ref 1.005–1.030)
UROBILINOGEN UA: 0.2 mg/dL (ref 0.0–1.0)
pH: 5 (ref 5.0–8.0)

## 2014-10-30 LAB — PREGNANCY, URINE: Preg Test, Ur: NEGATIVE

## 2014-10-30 LAB — URINE MICROSCOPIC-ADD ON

## 2014-10-30 NOTE — ED Notes (Signed)
Onset x 1 week of vaginal discharge and low abd pain

## 2014-10-31 LAB — WET PREP, GENITAL
TRICH WET PREP: NONE SEEN
YEAST WET PREP: NONE SEEN

## 2014-10-31 LAB — HIV ANTIBODY (ROUTINE TESTING W REFLEX): HIV 1&2 Ab, 4th Generation: NONREACTIVE

## 2014-10-31 MED ORDER — AZITHROMYCIN 250 MG PO TABS
1000.0000 mg | ORAL_TABLET | Freq: Once | ORAL | Status: AC
  Administered 2014-10-31: 1000 mg via ORAL
  Filled 2014-10-31: qty 4

## 2014-10-31 MED ORDER — CEFTRIAXONE SODIUM 250 MG IJ SOLR
250.0000 mg | Freq: Once | INTRAMUSCULAR | Status: AC
  Administered 2014-10-31: 250 mg via INTRAMUSCULAR
  Filled 2014-10-31: qty 250

## 2014-10-31 NOTE — Discharge Instructions (Signed)

## 2014-10-31 NOTE — ED Provider Notes (Signed)
CSN: [id number]     Arrival date & time 10/30/14  2306 History   First MD Initiated Contact with Patient 10/30/14 2359     Chief Complaint  Patient presents with  . Vaginal Discharge    Patient is a 33 y.o. female presenting with vaginal discharge. The history is provided by the patient.  Vaginal Discharge Quality:  White Severity:  Moderate Onset quality:  Gradual Duration:  1 week Timing:  Constant Progression:  Worsening Chronicity:  New Relieved by:  None tried Worsened by:  Nothing tried Associated symptoms: abdominal pain and dysuria   Associated symptoms: no dyspareunia, no fever, no genital lesions and no vomiting   Abdominal pain:    Location:  LLQ and RLQ   Quality:  Aching   Severity:  Mild   Onset quality:  Gradual   Duration:  1 week   Timing:  Intermittent   Progression:  Unchanged   Chronicity:  New Risk factors: STI exposure   Risk factors comment:  Prior STD Patient presents with vaginal discharge/abdominal pain for one week She reports h/o STD in the past.  She has had intercourse recently without using protection     PMH - none Soc hx - smoker History  Substance Use Topics  . Smoking status: Current Some Day Smoker    Types: Cigarettes  . Smokeless tobacco: Not on file  . Alcohol Use: Yes     Comment: ocassionally   OB History    No data available     Review of Systems  Constitutional: Negative for fever.  Respiratory: Negative for cough.   Gastrointestinal: Positive for abdominal pain. Negative for vomiting.  Genitourinary: Positive for dysuria and vaginal discharge. Negative for dyspareunia.  Neurological: Negative for weakness.  All other systems reviewed and are negative.     Allergies  Review of patient's allergies indicates no known allergies.  Home Medications   Prior to Admission medications   Not on File   BP 128/86 mmHg  Pulse 84  Temp(Src) 98.8 F (37.1 C) (Oral)  Resp 18  Ht 5\' 5"  (1.651 m)  Wt 133 lb (60.328  kg)  BMI 22.13 kg/m2  SpO2 98% Physical Exam CONSTITUTIONAL: Well developed/well nourished HEAD: Normocephalic/atraumatic EYES: EOMI/PERRL ENMT: Mucous membranes moist NECK: supple no meningeal signs SPINE/BACK:entire spine nontender CV: S1/S2 noted, no murmurs/rubs/gallops noted LUNGS: Lungs are clear to auscultation bilaterally, no apparent distress ABDOMEN: soft, nontender, no rebound or guarding, bowel sounds noted throughout abdomen GU:no cva tenderness. Copious white discharge.  There are no external lesions.  Mild CMT.  No vaginal bleeding noted.  Female chaperone present.   NEURO: Pt is awake/alert/appropriate, moves all extremitiesx4.  No facial droop.   EXTREMITIES: pulses normal/equal, full ROM SKIN: warm, color normal PSYCH: no abnormalities of mood noted, alert and oriented to situation  ED Course  Procedures  Labs Review Labs Reviewed  WET PREP, GENITAL - Abnormal; Notable for the following:    Clue Cells Wet Prep HPF POC TOO NUMEROUS TO COUNT (*)    WBC, Wet Prep HPF POC FEW (*)    All other components within normal limits  URINALYSIS, ROUTINE W REFLEX MICROSCOPIC - Abnormal; Notable for the following:    APPearance CLOUDY (*)    Hgb urine dipstick TRACE (*)    All other components within normal limits  URINE MICROSCOPIC-ADD ON - Abnormal; Notable for the following:    Squamous Epithelial / LPF MANY (*)    Bacteria, UA MANY (*)  All other components within normal limits  GC/CHLAMYDIA PROBE AMP  PREGNANCY, URINE  HIV ANTIBODY (ROUTINE TESTING)    Medications  cefTRIAXone (ROCEPHIN) injection 250 mg (250 mg Intramuscular Given 10/31/14 0032)  azithromycin (ZITHROMAX) tablet 1,000 mg (1,000 mg Oral Given 10/31/14 0032)     MDM  Patient with h/o STD in past ([facility] presents with abd pain and vaginal discharge.  She admits to sexual intercourse without condoms.  Will empirically treat with rocephin/azithromycin.  I  discussed importance of safe sex practices   Final diagnoses:  Lower abdominal pain  Vaginitis    Nursing notes including past medical history and social history reviewed and considered in documentation Labs/vital reviewed myself and considered during evaluation Previous records reviewed and considered     Joya Gaskinsonald W Braleigh Massoud, MD 10/31/14 0145

## 2014-11-01 LAB — GC/CHLAMYDIA PROBE AMP
CT PROBE, AMP APTIMA: NEGATIVE
GC Probe RNA: NEGATIVE

## 2015-04-21 ENCOUNTER — Encounter (HOSPITAL_BASED_OUTPATIENT_CLINIC_OR_DEPARTMENT_OTHER): Payer: Self-pay | Admitting: *Deleted

## 2015-04-21 ENCOUNTER — Emergency Department (HOSPITAL_BASED_OUTPATIENT_CLINIC_OR_DEPARTMENT_OTHER)
Admission: EM | Admit: 2015-04-21 | Discharge: 2015-04-21 | Disposition: A | Discharge status: Home / Self Care | Payer: Medicaid Other | Attending: Emergency Medicine | Admitting: Emergency Medicine

## 2015-04-21 DIAGNOSIS — N760 Acute vaginitis: Secondary | ICD-10-CM | POA: Diagnosis not present

## 2015-04-21 DIAGNOSIS — Z720 Tobacco use: Secondary | ICD-10-CM | POA: Diagnosis not present

## 2015-04-21 DIAGNOSIS — Z202 Contact with and (suspected) exposure to infections with a predominantly sexual mode of transmission: Secondary | ICD-10-CM | POA: Diagnosis present

## 2015-04-21 DIAGNOSIS — B9689 Other specified bacterial agents as the cause of diseases classified elsewhere: Secondary | ICD-10-CM

## 2015-04-21 DIAGNOSIS — Z3202 Encounter for pregnancy test, result negative: Secondary | ICD-10-CM | POA: Diagnosis not present

## 2015-04-21 LAB — PREGNANCY, URINE: PREG TEST UR: NEGATIVE

## 2015-04-21 LAB — URINALYSIS, ROUTINE W REFLEX MICROSCOPIC
BILIRUBIN URINE: NEGATIVE
Glucose, UA: NEGATIVE mg/dL
Hgb urine dipstick: NEGATIVE
Ketones, ur: NEGATIVE mg/dL
LEUKOCYTES UA: NEGATIVE
NITRITE: NEGATIVE
PH: 6 (ref 5.0–8.0)
Protein, ur: NEGATIVE mg/dL
SPECIFIC GRAVITY, URINE: 1.019 (ref 1.005–1.030)
UROBILINOGEN UA: 0.2 mg/dL (ref 0.0–1.0)

## 2015-04-21 LAB — WET PREP, GENITAL
TRICH WET PREP: NONE SEEN
Yeast Wet Prep HPF POC: NONE SEEN

## 2015-04-21 LAB — GC/CHLAMYDIA PROBE AMP (~~LOC~~) NOT AT ARMC
Chlamydia: NEGATIVE
NEISSERIA GONORRHEA: POSITIVE — AB

## 2015-04-21 MED ORDER — IBUPROFEN 800 MG PO TABS: 800.0000 mg | ORAL_TABLET | Freq: Three times a day (TID) | ORAL | Status: DC

## 2015-04-21 MED ORDER — METRONIDAZOLE 500 MG PO TABS: 500.0000 mg | ORAL_TABLET | Freq: Two times a day (BID) | ORAL | Status: DC

## 2015-04-21 MED ORDER — METRONIDAZOLE 500 MG PO TABS
500.0000 mg | ORAL_TABLET | Freq: Once | ORAL | Status: AC
  Administered 2015-04-21: 500 mg via ORAL
  Filled 2015-04-21: qty 1

## 2015-04-21 MED ORDER — IBUPROFEN 800 MG PO TABS
800.0000 mg | ORAL_TABLET | Freq: Once | ORAL | Status: AC
  Administered 2015-04-21: 800 mg via ORAL
  Filled 2015-04-21: qty 1

## 2015-04-21 NOTE — Discharge Instructions (Signed)
Bacterial Vaginosis Bacterial vaginosis is a vaginal infection that occurs when the normal balance of bacteria in the vagina is disrupted. It results from an overgrowth of certain bacteria. This is the most common vaginal infection in women of childbearing age. Treatment is important to prevent complications, especially in pregnant women, as it can cause a premature delivery. CAUSES  Bacterial vaginosis is caused by an increase in harmful bacteria that are normally present in smaller amounts in the vagina. Several different kinds of bacteria can cause bacterial vaginosis. However, the reason that the condition develops is not fully understood. RISK FACTORS Certain activities or behaviors can put you at an increased risk of developing bacterial vaginosis, including:  Having a new sex partner or multiple sex partners.  Douching.  Using an intrauterine device (IUD) for contraception. Women do not get bacterial vaginosis from toilet seats, bedding, swimming pools, or contact with objects around them. SIGNS AND SYMPTOMS  Some women with bacterial vaginosis have no signs or symptoms. Common symptoms include:  Grey vaginal discharge.  A fishlike odor with discharge, especially after sexual intercourse.  Itching or burning of the vagina and vulva.  Burning or pain with urination. DIAGNOSIS  Your health care provider will take a medical history and examine the vagina for signs of bacterial vaginosis. A sample of vaginal fluid may be taken. Your health care provider will look at this sample under a microscope to check for bacteria and abnormal cells. A vaginal pH test may also be done.  TREATMENT  Bacterial vaginosis may be treated with antibiotic medicines. These may be given in the form of a pill or a vaginal cream. A second round of antibiotics may be prescribed if the condition comes back after treatment.  HOME CARE INSTRUCTIONS   Only take over-the-counter or prescription medicines as  directed by your health care provider.  If antibiotic medicine was prescribed, take it as directed. Make sure you finish it even if you start to feel better.  Do not have sex until treatment is completed.  Tell all sexual partners that you have a vaginal infection. They should see their health care provider and be treated if they have problems, such as a mild rash or itching.  Practice safe sex by using condoms and only having one sex partner. SEEK MEDICAL CARE IF:   Your symptoms are not improving after 3 days of treatment.  You have increased discharge or pain.  You have a fever. MAKE SURE YOU:   Understand these instructions.  Will watch your condition.  Will get help right away if you are not doing well or get worse. FOR MORE INFORMATION  Centers for Disease Control and Prevention, Division of STD Prevention: www.cdc.gov/std American Sexual Health Association (ASHA): www.ashastd.org  Document Released: 11/28/2005 Document Revised: 09/18/2013 Document Reviewed: 07/10/2013 Exit[employer] Patient Information 2015 [employer]. This information is not intended to replace advice given to you by your health care provider. Make sure you discuss any questions you have with your health care provider.  

## 2015-04-21 NOTE — ED Notes (Signed)
Pt reports vaginal discharge, lower pelvic pain x 1 week.

## 2015-04-21 NOTE — ED Provider Notes (Signed)
CSN: [id number]     Arrival date & time 04/21/15  0243 History   First MD Initiated Contact with Patient 04/21/15 [phone]     Chief Complaint  Patient presents with  . Exposure to STD     (Consider location/radiation/quality/duration/timing/severity/associated sxs/prior Treatment) Patient is a 34 y.o. female presenting with vaginal discharge. The history is provided by the patient.  Vaginal Discharge Quality:  White Severity:  Moderate Onset quality:  Gradual Duration:  7 days Timing:  Constant Progression:  Unchanged Chronicity:  Recurrent Context: spontaneously   Relieved by:  Nothing Worsened by:  Nothing tried Ineffective treatments:  None tried Associated symptoms: no fever and no urinary frequency   Risk factors: no gynecological surgery     History reviewed. No pertinent past medical history. History reviewed. No pertinent past surgical history. History reviewed. No pertinent family history. History  Substance Use Topics  . Smoking status: Current Some Day Smoker -- 0.50 packs/day    Types: Cigarettes  . Smokeless tobacco: Not on file  . Alcohol Use: Yes     Comment: ocassionally   OB History    No data available     Review of Systems  Constitutional: Negative for fever.  Genitourinary: Positive for vaginal discharge.  All other systems reviewed and are negative.     Allergies  Review of patient's allergies indicates no known allergies.  Home Medications   Prior to Admission medications   Not on File   BP 134/101 mmHg  Pulse 84  Temp(Src) 98.6 F (37 C) (Oral)  Resp 18  Ht 5\' 5"  (1.651 m)  Wt 130 lb (58.968 kg)  BMI 21.63 kg/m2  SpO2 100%  LMP 04/07/2015 Physical Exam  Constitutional: She is oriented to person, place, and time. She appears well-developed and well-nourished. No distress.  HENT:  Head: Normocephalic and atraumatic.  Mouth/Throat: Oropharynx is clear and moist.  Eyes: Conjunctivae are normal. Pupils are equal, round, and  reactive to light.  Neck: Normal range of motion. Neck supple.  Cardiovascular: Normal rate, regular rhythm and intact distal pulses.   Pulmonary/Chest: Effort normal and breath sounds normal. No respiratory distress. She has no wheezes. She has no rales.  Abdominal: Soft. Bowel sounds are normal. There is no tenderness. There is no rebound and no guarding.  Genitourinary: Vaginal discharge found.  White discharge chaperone present no cmt  Musculoskeletal: Normal range of motion.  Neurological: She is alert and oriented to person, place, and time.  Skin: Skin is warm and dry.  Psychiatric: She has a normal mood and affect.    ED Course  Procedures (including critical care time) Labs Review Labs Reviewed  WET PREP, GENITAL - Abnormal; Notable for the following:    Clue Cells Wet Prep HPF POC MODERATE (*)    WBC, Wet Prep HPF POC FEW (*)    All other components within normal limits  URINALYSIS, ROUTINE W REFLEX MICROSCOPIC  PREGNANCY, URINE  GC/CHLAMYDIA PROBE AMP ([place])    Imaging Review No results found.   EKG Interpretation None      MDM   Final diagnoses:  None    Will treat for bacterial vaginosis.  Follow up with your GYN for ongoing care    Davis Vannatter, MD 04/21/15 [phone]

## 2015-04-22 ENCOUNTER — Telehealth (HOSPITAL_BASED_OUTPATIENT_CLINIC_OR_DEPARTMENT_OTHER): Payer: Self-pay | Admitting: Emergency Medicine

## 2015-04-22 NOTE — Telephone Encounter (Signed)
+   Gonorrhea, chart handoff to EDP for treatment plan

## 2015-04-24 ENCOUNTER — Telehealth (HOSPITAL_COMMUNITY): Payer: Self-pay | Admitting: *Deleted

## 2015-06-11 ENCOUNTER — Encounter (HOSPITAL_BASED_OUTPATIENT_CLINIC_OR_DEPARTMENT_OTHER): Payer: Self-pay | Admitting: *Deleted

## 2015-06-11 ENCOUNTER — Emergency Department (HOSPITAL_BASED_OUTPATIENT_CLINIC_OR_DEPARTMENT_OTHER)
Admission: EM | Admit: 2015-06-11 | Discharge: 2015-06-11 | Disposition: A | Discharge status: Home / Self Care | Payer: Medicaid Other | Attending: Emergency Medicine | Admitting: Emergency Medicine

## 2015-06-11 DIAGNOSIS — B9689 Other specified bacterial agents as the cause of diseases classified elsewhere: Secondary | ICD-10-CM

## 2015-06-11 DIAGNOSIS — N760 Acute vaginitis: Secondary | ICD-10-CM | POA: Insufficient documentation

## 2015-06-11 DIAGNOSIS — R109 Unspecified abdominal pain: Secondary | ICD-10-CM

## 2015-06-11 DIAGNOSIS — R42 Dizziness and giddiness: Secondary | ICD-10-CM | POA: Insufficient documentation

## 2015-06-11 DIAGNOSIS — Z7901 Long term (current) use of anticoagulants: Secondary | ICD-10-CM | POA: Insufficient documentation

## 2015-06-11 DIAGNOSIS — Z3202 Encounter for pregnancy test, result negative: Secondary | ICD-10-CM | POA: Insufficient documentation

## 2015-06-11 DIAGNOSIS — Z720 Tobacco use: Secondary | ICD-10-CM | POA: Insufficient documentation

## 2015-06-11 LAB — WET PREP, GENITAL
Trich, Wet Prep: NONE SEEN
YEAST WET PREP: NONE SEEN

## 2015-06-11 LAB — URINALYSIS, ROUTINE W REFLEX MICROSCOPIC
Bilirubin Urine: NEGATIVE
GLUCOSE, UA: NEGATIVE mg/dL
Hgb urine dipstick: NEGATIVE
KETONES UR: NEGATIVE mg/dL
Leukocytes, UA: NEGATIVE
Nitrite: NEGATIVE
Protein, ur: NEGATIVE mg/dL
SPECIFIC GRAVITY, URINE: 1.003 — AB (ref 1.005–1.030)
UROBILINOGEN UA: 0.2 mg/dL (ref 0.0–1.0)
pH: 6 (ref 5.0–8.0)

## 2015-06-11 LAB — CBC
HEMATOCRIT: 36.1 % (ref 36.0–46.0)
Hemoglobin: 12.4 g/dL (ref 12.0–15.0)
MCH: 31.3 pg (ref 26.0–34.0)
MCHC: 34.3 g/dL (ref 30.0–36.0)
MCV: 91.2 fL (ref 78.0–100.0)
PLATELETS: 242 10*3/uL (ref 150–400)
RBC: 3.96 MIL/uL (ref 3.87–5.11)
RDW: 11.7 % (ref 11.5–15.5)
WBC: 4.8 10*3/uL (ref 4.0–10.5)

## 2015-06-11 LAB — PREGNANCY, URINE: Preg Test, Ur: NEGATIVE

## 2015-06-11 MED ORDER — METRONIDAZOLE 500 MG PO TABS: 500.0000 mg | ORAL_TABLET | Freq: Two times a day (BID) | ORAL | Status: DC

## 2015-06-11 MED ORDER — IBUPROFEN 800 MG PO TABS
800.0000 mg | ORAL_TABLET | Freq: Once | ORAL | Status: AC
  Administered 2015-06-11: 800 mg via ORAL
  Filled 2015-06-11: qty 1

## 2015-06-11 NOTE — Discharge Instructions (Signed)
Bacterial Vaginosis Bacterial vaginosis is a vaginal infection that occurs when the normal balance of bacteria in the vagina is disrupted. It results from an overgrowth of certain bacteria. This is the most common vaginal infection in women of childbearing age. Treatment is important to prevent complications, especially in pregnant women, as it can cause a premature delivery. CAUSES  Bacterial vaginosis is caused by an increase in harmful bacteria that are normally present in smaller amounts in the vagina. Several different kinds of bacteria can cause bacterial vaginosis. However, the reason that the condition develops is not fully understood. RISK FACTORS Certain activities or behaviors can put you at an increased risk of developing bacterial vaginosis, including:  Having a new sex partner or multiple sex partners.  Douching.  Using an intrauterine device (IUD) for contraception. Women do not get bacterial vaginosis from toilet seats, bedding, swimming pools, or contact with objects around them. SIGNS AND SYMPTOMS  Some women with bacterial vaginosis have no signs or symptoms. Common symptoms include:  Grey vaginal discharge.  A fishlike odor with discharge, especially after sexual intercourse.  Itching or burning of the vagina and vulva.  Burning or pain with urination. DIAGNOSIS  Your health care provider will take a medical history and examine the vagina for signs of bacterial vaginosis. A sample of vaginal fluid may be taken. Your health care provider will look at this sample under a microscope to check for bacteria and abnormal cells. A vaginal pH test may also be done.  TREATMENT  Bacterial vaginosis may be treated with antibiotic medicines. These may be given in the form of a pill or a vaginal cream. A second round of antibiotics may be prescribed if the condition comes back after treatment.  HOME CARE INSTRUCTIONS   Only take over-the-counter or prescription medicines as  directed by your health care provider.  If antibiotic medicine was prescribed, take it as directed. Make sure you finish it even if you start to feel better.  Do not have sex until treatment is completed.  Tell all sexual partners that you have a vaginal infection. They should see their health care provider and be treated if they have problems, such as a mild rash or itching.  Practice safe sex by using condoms and only having one sex partner. SEEK MEDICAL CARE IF:   Your symptoms are not improving after 3 days of treatment.  You have increased discharge or pain.  You have a fever. MAKE SURE YOU:   Understand these instructions.  Will watch your condition.  Will get help right away if you are not doing well or get worse. FOR MORE INFORMATION  Centers for Disease Control and Prevention, Division of STD Prevention: www.cdc.gov/std American Sexual Health Association (ASHA): www.ashastd.org  Document Released: 11/28/2005 Document Revised: 09/18/2013 Document Reviewed: 07/10/2013 Exit[employer] Patient Information 2015 [employer]. This information is not intended to replace advice given to you by your health care provider. Make sure you discuss any questions you have with your health care provider.  

## 2015-06-11 NOTE — ED Provider Notes (Signed)
CSN: [id number]     Arrival date & time 06/11/15  1743 History  This chart was scribed for Elwin MochaBlair Kurtiss Wence, MD by Phillis HaggisGabriella Gaje, ED Scribe. This patient was seen in room MH11/MH11 and patient care was started at 6:37 PM.   Chief Complaint  Patient presents with  . Abdominal Pain   Patient is a 34 y.o. female presenting with abdominal pain. The history is provided by the patient. No language interpreter was used.  Abdominal Pain Pain location:  Periumbilical (right periumbilical) Pain quality: pressure   Pain radiates to:  Does not radiate Pain severity:  Moderate Onset quality:  Sudden Duration:  1 week Timing:  Constant Progression:  Waxing and waning Chronicity:  New Context comment:  After menses Relieved by:  None tried Worsened by:  Nothing tried Ineffective treatments:  None tried Associated symptoms: vaginal discharge   Associated symptoms: no chills   Vaginal discharge:    Quality:  White   Severity:  Mild   Onset quality:  Sudden   Duration:  1 week   Timing:  Intermittent   Progression:  Unchanged   Chronicity:  New   HPI Comments: Rebecca Bradley is a 34 y.o. female with a history of ovarian cysts who presents to the Emergency Department complaining of waxing and waning pressured right periumbilical abdominal pain onset one week ago. States that she was seen a month ago and was diagnosed with BV and prescribed Flagyl but was not able to finish her prescription because the pills got wet. Reports associated white discharge, apparent with urination, that showed up after her LMP which was very heavy. She does not know if it is due to the fact that she never finished her prescription. She reports lightheadedness and was told that she had a low grade fever upon arrival, tmax 99.2 F. She was last tested for STDs in November and the labs were negative. Denies chills or any other symptoms. Denies taking anything for her symptoms or any allergies to medications.   History reviewed.  No pertinent past medical history. History reviewed. No pertinent past surgical history. No family history on file. History  Substance Use Topics  . Smoking status: Current Some Day Smoker -- 0.50 packs/day    Types: Cigarettes  . Smokeless tobacco: Not on file  . Alcohol Use: Yes     Comment: ocassionally   OB History    No data available     Review of Systems  Constitutional: Negative for chills.  Gastrointestinal: Positive for abdominal pain.  Genitourinary: Positive for vaginal discharge.  Neurological: Positive for light-headedness.  All other systems reviewed and are negative.  Allergies  Review of patient's allergies indicates no known allergies.  Home Medications   Prior to Admission medications   Medication Sig Start Date End Date Taking? Authorizing Provider  ibuprofen (ADVIL,MOTRIN) 800 MG tablet Take 1 tablet (800 mg total) by mouth 3 (three) times daily. 04/21/15   April Palumbo, MD  metroNIDAZOLE (FLAGYL) 500 MG tablet Take 1 tablet (500 mg total) by mouth 2 (two) times daily. One po bid x 7 days 04/21/15   April Palumbo, MD   BP 108/71 mmHg  Pulse 87  Temp(Src) 99.2 F (37.3 C) (Oral)  Resp 18  Ht 5\' 5"  (1.651 m)  Wt 130 lb (58.968 kg)  BMI 21.63 kg/m2  SpO2 100%  LMP 05/31/2015 Physical Exam  Constitutional: She is oriented to person, place, and time. She appears well-developed and well-nourished. No distress.  HENT:  Head:  Normocephalic and atraumatic.  Mouth/Throat: Oropharynx is clear and moist.  Eyes: EOM are normal. Pupils are equal, round, and reactive to light.  Neck: Normal range of motion. Neck supple.  Cardiovascular: Normal rate and regular rhythm.  Exam reveals no friction rub.   No murmur heard. Pulmonary/Chest: Effort normal and breath sounds normal. No respiratory distress. She has no wheezes. She has no rales.  Abdominal: Soft. She exhibits no distension. There is no tenderness. There is no rebound.  Genitourinary: Cervix exhibits  discharge (thick and white). Right adnexum displays no mass and no tenderness. Left adnexum displays no mass and no tenderness.  Musculoskeletal: Normal range of motion. She exhibits no edema.  Neurological: She is alert and oriented to person, place, and time.  Skin: She is not diaphoretic.  Nursing note and vitals reviewed.   ED Course  Procedures (including critical care time) DIAGNOSTIC STUDIES: Oxygen Saturation is 100% on RA, normal by my interpretation.    COORDINATION OF CARE: 6:39 PM-Discussed treatment plan which includes pelvic exam and labs with pt at bedside and pt agreed to plan; told pt to follow up with OB/GYN  Labs Review Labs Reviewed  URINALYSIS, ROUTINE W REFLEX MICROSCOPIC (NOT AT [facility]) - Abnormal; Notable for the following:    Color, Urine STRAW (*)    Specific Gravity, Urine 1.003 (*)    All other components within normal limits  WET PREP, GENITAL  PREGNANCY, URINE  CBC  GC/CHLAMYDIA PROBE AMP ([place]) NOT AT [facility]    Imaging Review No results found.   EKG Interpretation None      MDM   Final diagnoses:  BV (bacterial vaginosis)  Abdominal cramping    34 year old female here with 1 week of intermittent lower abdominal pain. Began after her menses. She's had some thin white vaginal discharge also. This is similar to BV that she had previously. Pain is sharp and described as "air bubbles." No fever, vomiting, diarrhea, dysuria. Suprapubic pain on exam. Will obtain pelvic exam. History not c/w ovarian torsion. Pelvic exam with white discharge. Wet prep positive for BV. Patient given Flagyl, given women's outpatient clinic for follow-up.  I personally performed the services described in this documentation, which was scribed in my presence. The recorded information has been reviewed and is accurate.     Elwin Mocha, MD 06/11/15 [phone]

## 2015-06-11 NOTE — ED Notes (Signed)
Abdominal pain. Vaginal discharge after her menses.

## 2015-06-12 LAB — GC/CHLAMYDIA PROBE AMP (~~LOC~~) NOT AT ARMC
Chlamydia: NEGATIVE
Neisseria Gonorrhea: NEGATIVE

## 2015-09-15 ENCOUNTER — Emergency Department (HOSPITAL_BASED_OUTPATIENT_CLINIC_OR_DEPARTMENT_OTHER)
Admission: EM | Admit: 2015-09-15 | Discharge: 2015-09-15 | Disposition: A | Discharge status: Home / Self Care | Payer: Medicaid - Out of State | Attending: Emergency Medicine | Admitting: Emergency Medicine

## 2015-09-15 ENCOUNTER — Encounter (HOSPITAL_BASED_OUTPATIENT_CLINIC_OR_DEPARTMENT_OTHER): Payer: Self-pay | Admitting: Emergency Medicine

## 2015-09-15 DIAGNOSIS — R000 Tachycardia, unspecified: Secondary | ICD-10-CM | POA: Diagnosis not present

## 2015-09-15 DIAGNOSIS — Z720 Tobacco use: Secondary | ICD-10-CM | POA: Insufficient documentation

## 2015-09-15 DIAGNOSIS — B9689 Other specified bacterial agents as the cause of diseases classified elsewhere: Secondary | ICD-10-CM

## 2015-09-15 DIAGNOSIS — N760 Acute vaginitis: Secondary | ICD-10-CM | POA: Diagnosis not present

## 2015-09-15 DIAGNOSIS — Z206 Contact with and (suspected) exposure to human immunodeficiency virus [HIV]: Secondary | ICD-10-CM

## 2015-09-15 DIAGNOSIS — Z791 Long term (current) use of non-steroidal anti-inflammatories (NSAID): Secondary | ICD-10-CM | POA: Diagnosis not present

## 2015-09-15 DIAGNOSIS — Z3202 Encounter for pregnancy test, result negative: Secondary | ICD-10-CM | POA: Insufficient documentation

## 2015-09-15 DIAGNOSIS — N898 Other specified noninflammatory disorders of vagina: Secondary | ICD-10-CM | POA: Diagnosis present

## 2015-09-15 LAB — URINALYSIS, ROUTINE W REFLEX MICROSCOPIC
Bilirubin Urine: NEGATIVE
GLUCOSE, UA: NEGATIVE mg/dL
Hgb urine dipstick: NEGATIVE
Ketones, ur: NEGATIVE mg/dL
LEUKOCYTES UA: NEGATIVE
Nitrite: NEGATIVE
PROTEIN: NEGATIVE mg/dL
SPECIFIC GRAVITY, URINE: 1.015 (ref 1.005–1.030)
Urobilinogen, UA: 0.2 mg/dL (ref 0.0–1.0)
pH: 6 (ref 5.0–8.0)

## 2015-09-15 LAB — WET PREP, GENITAL
TRICH WET PREP: NONE SEEN
Yeast Wet Prep HPF POC: NONE SEEN

## 2015-09-15 LAB — RAPID HIV SCREEN (HIV 1/2 AB+AG)
HIV 1/2 Antibodies: NONREACTIVE
HIV-1 P24 Antigen - HIV24: NONREACTIVE

## 2015-09-15 LAB — PREGNANCY, URINE: Preg Test, Ur: NEGATIVE

## 2015-09-15 MED ORDER — LIDOCAINE HCL (PF) 2 % IJ SOLN
INTRAMUSCULAR | Status: AC
  Administered 2015-09-15: 1.2 mL
  Filled 2015-09-15: qty 2

## 2015-09-15 MED ORDER — AZITHROMYCIN 250 MG PO TABS
1000.0000 mg | ORAL_TABLET | Freq: Once | ORAL | Status: AC
  Administered 2015-09-15: 1000 mg via ORAL
  Filled 2015-09-15: qty 4

## 2015-09-15 MED ORDER — CEFTRIAXONE SODIUM 250 MG IJ SOLR
250.0000 mg | Freq: Once | INTRAMUSCULAR | Status: AC
  Administered 2015-09-15: 250 mg via INTRAMUSCULAR
  Filled 2015-09-15: qty 250

## 2015-09-15 MED ORDER — METRONIDAZOLE 500 MG PO TABS: 500.0000 mg | ORAL_TABLET | Freq: Two times a day (BID) | ORAL | Status: DC

## 2015-09-15 NOTE — ED Provider Notes (Signed)
CSN: [id number]     Arrival date & time 09/15/15  1347 History   First MD Initiated Contact with Patient 09/15/15 1349     No chief complaint on file.    (Consider location/radiation/quality/duration/timing/severity/associated sxs/prior Treatment) HPI   34 year old female who presents for evaluation of vaginal discharge.  Patient is here with concerns of STDs. Patient states that her recent boyfriend that she broke up with told her that he is HIV positive today. She admits to having unprotected sex with him a month ago and she was unaware of this HIV status. She has noticed vaginal discharge for the past week. States that vaginal discharge is mild. Denies having any fever, chills, chest pain, shortness of breath, abdominal pain, back pain, dysuria, hematuria, vaginal bleeding, or rash. Remote history of gonorrhea and trichomonas. Last HIV test was this past November and was negative. Patient's states she's only sexually active with one partner within the past 6 months. Patient mentioned "I don't know if he has HIV or if he is playing a sick joke on me." Her last menstrual period was 08/22/2015.  No past medical history on file. No past surgical history on file. No family history on file. Social History  Substance Use Topics  . Smoking status: Current Some Day Smoker -- 0.50 packs/day    Types: Cigarettes  . Smokeless tobacco: Not on file  . Alcohol Use: Yes     Comment: ocassionally   OB History    No data available     Review of Systems  All other systems reviewed and are negative.     Allergies  Review of patient's allergies indicates no known allergies.  Home Medications   Prior to Admission medications   Medication Sig Start Date End Date Taking? Authorizing Provider  ibuprofen (ADVIL,MOTRIN) 800 MG tablet Take 1 tablet (800 mg total) by mouth 3 (three) times daily. 04/21/15   April Palumbo, MD  metroNIDAZOLE (FLAGYL) 500 MG tablet Take 1 tablet (500 mg total) by mouth 2  (two) times daily. One po bid x 7 days 04/21/15   April Palumbo, MD  metroNIDAZOLE (FLAGYL) 500 MG tablet Take 1 tablet (500 mg total) by mouth 2 (two) times daily. One po bid x 7 days 06/11/15   Elwin Mocha, MD   BP 158/99 mmHg  Pulse 105  Temp(Src) 99.2 F (37.3 C) (Oral)  Resp 20  Ht  (1.651 m)  Wt 140 lb (63.504 kg)  BMI 23.30 kg/m2  SpO2 100% Physical Exam  Constitutional: She appears well-developed and well-nourished. No distress.  HENT:  Head: Atraumatic.  Eyes: Conjunctivae are normal.  Neck: Neck supple.  Cardiovascular:  Mild tachycardia without murmurs rubs or gallops  Pulmonary/Chest: Effort normal and breath sounds normal.  Abdominal: Soft. Bowel sounds are normal. She exhibits no distension. There is no tenderness.  Genitourinary:  Chaperone present during exam. No inguinal lymphadenopathy or inguinal hernia noted. Normal external genitalia. A small sebaceous cyst noted to right labia majora that is nontender to palpation and does not appear infected. No pain with speculum insertion. Moderate white vaginal discharge noted in vaginal vault. Closed cervical os. On bimanual examination no adnexal tenderness or cervical motion tenderness.  Neurological: She is alert.  Skin: No rash noted.  Psychiatric: She has a normal mood and affect.  Nursing note and vitals reviewed.   ED Course  Procedures (including critical care time)  Patient is here with concerns of HIV exposure. Aside from mild vaginal discharge she has no other  specific complaint. No abdominal pain on exam  3:47 PM Rapid HIV screen showing that HIV 1, PT 24 antigen, HIV 24 as well as HIV 1-1/2 antibodies are nonreactive. Patient made aware of the finding and understands that the rapid HIV only tested for those specific antigen and antibodies. Encouraged patient to follow-up with the health center for further testing if concern. Wet prep shows many clue cells concerning for her vaginosis, will treat with  metronidazole. Many WBC also noted in the wet prep. Gonorrhea and Chlamydia culture sent however Rocephin and Zithromax antibiotic will be given in the ED for prophylaxis. Her pregnancy test is negative and her urine shows no evidence of urinary tract infection.  Labs Review Labs Reviewed  WET PREP, GENITAL - Abnormal; Notable for the following:    Clue Cells Wet Prep HPF POC MANY (*)    WBC, Wet Prep HPF POC MANY (*)    All other components within normal limits  URINALYSIS, ROUTINE W REFLEX MICROSCOPIC (NOT AT [facility])  PREGNANCY, URINE  RAPID HIV SCREEN (HIV 1/2 AB+AG)  RPR  GC/CHLAMYDIA PROBE AMP ([place]) NOT AT [facility]    Imaging Review No results found. I have personally reviewed and evaluated these images and lab results as part of my medical decision-making.   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

## 2015-09-15 NOTE — ED Notes (Signed)
Vaginal discharge since sept 29th.  Pt also concerned about other STD's.  Pt states her recent boyfriend that she broke up with has been telling her that he is HIV positive and she needs to be checked.  She has unprotected sex with him one month ago and she was unaware of this at the time.

## 2015-09-15 NOTE — Discharge Instructions (Signed)
Bacterial Vaginosis Bacterial vaginosis is a vaginal infection that occurs when the normal balance of bacteria in the vagina is disrupted. It results from an overgrowth of certain bacteria. This is the most common vaginal infection in women of childbearing age. Treatment is important to prevent complications, especially in pregnant women, as it can cause a premature delivery. CAUSES  Bacterial vaginosis is caused by an increase in harmful bacteria that are normally present in smaller amounts in the vagina. Several different kinds of bacteria can cause bacterial vaginosis. However, the reason that the condition develops is not fully understood. RISK FACTORS Certain activities or behaviors can put you at an increased risk of developing bacterial vaginosis, including:  Having a new sex partner or multiple sex partners.  Douching.  Using an intrauterine device (IUD) for contraception. Women do not get bacterial vaginosis from toilet seats, bedding, swimming pools, or contact with objects around them. SIGNS AND SYMPTOMS  Some women with bacterial vaginosis have no signs or symptoms. Common symptoms include:  Grey vaginal discharge.  A fishlike odor with discharge, especially after sexual intercourse.  Itching or burning of the vagina and vulva.  Burning or pain with urination. DIAGNOSIS  Your health care provider will take a medical history and examine the vagina for signs of bacterial vaginosis. A sample of vaginal fluid may be taken. Your health care provider will look at this sample under a microscope to check for bacteria and abnormal cells. A vaginal pH test may also be done.  TREATMENT  Bacterial vaginosis may be treated with antibiotic medicines. These may be given in the form of a pill or a vaginal cream. A second round of antibiotics may be prescribed if the condition comes back after treatment.  HOME CARE INSTRUCTIONS   Only take over-the-counter or prescription medicines as  directed by your health care provider.  If antibiotic medicine was prescribed, take it as directed. Make sure you finish it even if you start to feel better.  Do not have sex until treatment is completed.  Tell all sexual partners that you have a vaginal infection. They should see their health care provider and be treated if they have problems, such as a mild rash or itching.  Practice safe sex by using condoms and only having one sex partner. SEEK MEDICAL CARE IF:   Your symptoms are not improving after 3 days of treatment.  You have increased discharge or pain.  You have a fever. MAKE SURE YOU:   Understand these instructions.  Will watch your condition.  Will get help right away if you are not doing well or get worse. FOR MORE INFORMATION  Centers for Disease Control and Prevention, Division of STD Prevention: SolutionApps.[employer].za American Sexual Health Association (ASHA): www.ashastd.org  Document Released: 11/28/2005 Document Revised: 09/18/2013 Document Reviewed: 07/10/2013 [facility] Patient Information 2015 [place], [place]. This information is not intended to replace advice given to you by your health care provider. Make sure you discuss any questions you have with your health care provider.  HIV Antibody Test HIV is a virus which destroys our body's ability to fight illness. It does this by causing defects in our immune system. This is the system that protects our body against infections. This virus is the cause of an illness called AIDS. HIV antibodies are made by the infected person's body when a person becomes infected with HIV. WHAT IS THE HIV ANTIBODY TEST? This is a test for HIV antibodies that are found in the blood of an infected person.  This test is not a test for AIDS. It only means that you have been infected with HIV and may eventually develop AIDS. WHO IS AT RISK OF BEING INFECTED WITH HIV?  People who have unsafe sex (unsafe sex means having sex without a condom (or  other protective barrier) with a person who has the virus.  People who share IV needles or syringes with a person who has the virus.  Anyone who got blood, blood products, or organ transplants before 1985.  Babies born to mothers who have HIV.  Coming in contact with blood or other body fluids of someone infected with HIV. WHAT DOES A NEGATIVE HIV TEST RESULT MEAN? HIV antibodies were not found in your blood. Most of the time it takes our bodies between 6 weeks and 6 months to develop antibodies to HIV. It may take up to one year to develop. During this time, infected people can have a negative result even if they have the virus and will therefore not know if they are putting other people at risk. They should take all necessary precautions to protect others from becoming infected. WHAT DOES A POSITIVE HIV TEST RESULT MEAN?  A positive HIV test means a person has been infected with the HIV virus. This does NOT mean that a person has AIDS, but they may eventually develop it.  A person can give HIV infection to other people through unsafe sex. Sharing IV needles or syringes can also spread HIV.  A woman who has HIV can give the virus to her baby during pregnancy or at birth or possibly from breastfeeding. Get counseling prior to considering pregnancy. One third to one half of women with HIV infection will pass this infection on to their baby.  Anyone with a positive test for HIV should not donate blood, plasma, blood products, organs or tissues. WHERE CAN I GO TO BE TESTED?  Most county health departments offer HIV Antibody counseling and testing.  Many doctors and other caregivers offer HIV Antibody counseling and testing. If you received a blood test from your caregiver, call for your results as instructed. Remember it is your responsibility to get the results of your test. Do not assume everything is fine if you do not hear from your caregiver. If you get a positive test result, talk to your  caregiver to find out what steps to take to assure you receive the best of care. Numerous medications are now available which improve the course of this infection. Document Released: 11/25/2000 Document Revised: 02/20/2012 Document Reviewed: 02/28/2014 [facility] Patient Information 2015 [place], [place]. This information is not intended to replace advice given to you by your health care provider. Make sure you discuss any questions you have with your health care provider.

## 2015-09-16 LAB — GC/CHLAMYDIA PROBE AMP (~~LOC~~) NOT AT ARMC
Chlamydia: NEGATIVE
NEISSERIA GONORRHEA: NEGATIVE

## 2015-09-16 LAB — RPR: RPR Ser Ql: NONREACTIVE

## 2016-01-30 ENCOUNTER — Emergency Department (HOSPITAL_BASED_OUTPATIENT_CLINIC_OR_DEPARTMENT_OTHER)
Admission: EM | Admit: 2016-01-30 | Discharge: 2016-01-30 | Disposition: A | Discharge status: Home / Self Care | Payer: Medicaid Other | Attending: Emergency Medicine | Admitting: Emergency Medicine

## 2016-01-30 ENCOUNTER — Encounter (HOSPITAL_BASED_OUTPATIENT_CLINIC_OR_DEPARTMENT_OTHER): Payer: Self-pay | Admitting: *Deleted

## 2016-01-30 DIAGNOSIS — N760 Acute vaginitis: Secondary | ICD-10-CM | POA: Insufficient documentation

## 2016-01-30 DIAGNOSIS — B9689 Other specified bacterial agents as the cause of diseases classified elsewhere: Secondary | ICD-10-CM

## 2016-01-30 DIAGNOSIS — R1030 Lower abdominal pain, unspecified: Secondary | ICD-10-CM | POA: Diagnosis present

## 2016-01-30 DIAGNOSIS — F17210 Nicotine dependence, cigarettes, uncomplicated: Secondary | ICD-10-CM | POA: Insufficient documentation

## 2016-01-30 DIAGNOSIS — N72 Inflammatory disease of cervix uteri: Secondary | ICD-10-CM | POA: Diagnosis not present

## 2016-01-30 DIAGNOSIS — Z3202 Encounter for pregnancy test, result negative: Secondary | ICD-10-CM | POA: Insufficient documentation

## 2016-01-30 LAB — WET PREP, GENITAL
Sperm: NONE SEEN
Trich, Wet Prep: NONE SEEN
Yeast Wet Prep HPF POC: NONE SEEN

## 2016-01-30 LAB — URINALYSIS, ROUTINE W REFLEX MICROSCOPIC
BILIRUBIN URINE: NEGATIVE
GLUCOSE, UA: NEGATIVE mg/dL
KETONES UR: NEGATIVE mg/dL
Nitrite: NEGATIVE
PROTEIN: NEGATIVE mg/dL
Specific Gravity, Urine: 1.034 — ABNORMAL HIGH (ref 1.005–1.030)
pH: 5.5 (ref 5.0–8.0)

## 2016-01-30 LAB — PREGNANCY, URINE: PREG TEST UR: NEGATIVE

## 2016-01-30 LAB — URINE MICROSCOPIC-ADD ON

## 2016-01-30 MED ORDER — AZITHROMYCIN 250 MG PO TABS
1000.0000 mg | ORAL_TABLET | Freq: Once | ORAL | Status: AC
  Administered 2016-01-30: 1000 mg via ORAL
  Filled 2016-01-30: qty 4

## 2016-01-30 MED ORDER — CEFTRIAXONE SODIUM 250 MG IJ SOLR
250.0000 mg | Freq: Once | INTRAMUSCULAR | Status: AC
  Administered 2016-01-30: 250 mg via INTRAMUSCULAR
  Filled 2016-01-30: qty 250

## 2016-01-30 MED ORDER — LIDOCAINE HCL (PF) 1 % IJ SOLN
INTRAMUSCULAR | Status: AC
  Administered 2016-01-30: 2.1 mL
  Filled 2016-01-30: qty 5

## 2016-01-30 MED ORDER — METRONIDAZOLE 500 MG PO TABS: 500.0000 mg | ORAL_TABLET | Freq: Two times a day (BID) | ORAL | Status: DC

## 2016-01-30 NOTE — ED Provider Notes (Signed)
CSN: [id number]     Arrival date & time 01/30/16  1536 History  By signing my name below, I, Evon Slack, attest that this documentation has been prepared under the direction and in the presence of Jerelyn Scott, MD. Electronically Signed: Evon Slack, ED Scribe. 01/30/2016. 4:36 PM.      Chief Complaint  Patient presents with  . Abdominal Pain   Patient is a 35 y.o. female presenting with abdominal pain. The history is provided by the patient. No language interpreter was used.  Abdominal Pain Pain location:  Suprapubic Pain radiates to:  Does not radiate Pain severity:  Mild Duration:  1 week Relieved by:  None tried Worsened by:  Nothing tried Associated symptoms: vaginal discharge   Associated symptoms: no diarrhea, no dysuria, no fever, no nausea and no vomiting    HPI Comments: Rebecca Bradley is a 35 y.o. female who presents to the Emergency Department complaining of low abdominal pain onset 1 week prior. Pt states she has associated vaginal discharge, urinary frequency. Pt doesn't report any medications PTA. Pt denies fever, n/v/d or dysuria. Pt reports Hx of bacterial vaginosis.   History reviewed. No pertinent past medical history. History reviewed. No pertinent past surgical history. No family history on file. Social History  Substance Use Topics  . Smoking status: Current Some Day Smoker -- 0.50 packs/day    Types: Cigarettes  . Smokeless tobacco: Never Used  . Alcohol Use: Yes     Comment: ocassionally   OB History    No data available     Review of Systems  Constitutional: Negative for fever.  Gastrointestinal: Positive for abdominal pain. Negative for nausea, vomiting and diarrhea.  Genitourinary: Positive for frequency and vaginal discharge. Negative for dysuria.  ROS reviewed and all otherwise negative except for mentioned in HPI    Allergies  Review of patient's allergies indicates no known allergies.  Home Medications   Prior to Admission  medications   Medication Sig Start Date End Date Taking? Authorizing Provider  metroNIDAZOLE (FLAGYL) 500 MG tablet Take 1 tablet (500 mg total) by mouth 2 (two) times daily. 01/30/16   Jerelyn Scott, MD   BP 135/95 mmHg  Pulse 93  Temp(Src) 98.6 F (37 C) (Oral)  Resp 20  Ht  (1.651 m)  Wt 156 lb (70.761 kg)  BMI 25.96 kg/m2  SpO2 100%  LMP 01/08/2016  Vitals reviewed Physical Exam  Physical Examination: General appearance - alert, well appearing, and in no distress Mental status - alert, oriented to person, place, and time Eyes - no conjunctival injection no scleral icterus Chest - clear to auscultation, no wheezes, rales or rhonchi, symmetric air entry Heart - normal rate, regular rhythm, normal S1, S2, no murmurs, rubs, clicks or gallops Abdomen - soft, nontender, nondistended, no masses or organomegaly Pelvic - normal external genitalia, vulva, vagina, cervix, uterus and adnexa, copious white discharge, no CMT, no adnexal tenderness Neurological - alert, oriented, normal speech Extremities - peripheral pulses normal, no pedal edema, no clubbing or cyanosis Skin - normal coloration and turgor, no rashes  ED Course  Procedures (including critical care time) DIAGNOSTIC STUDIES: Oxygen Saturation is 100% on RA, normal by my interpretation.    COORDINATION OF CARE: 4:45 PM-Discussed treatment plan with pt at bedside and pt agreed to plan.     Labs Review Labs Reviewed  WET PREP, GENITAL - Abnormal; Notable for the following:    Clue Cells Wet Prep HPF POC PRESENT (*)  WBC, Wet Prep HPF POC FEW (*)    All other components within normal limits  URINALYSIS, ROUTINE W REFLEX MICROSCOPIC (NOT AT [facility]) - Abnormal; Notable for the following:    APPearance CLOUDY (*)    Specific Gravity, Urine 1.034 (*)    Hgb urine dipstick SMALL (*)    Leukocytes, UA MODERATE (*)    All other components within normal limits  URINE MICROSCOPIC-ADD ON - Abnormal; Notable for the  following:    Squamous Epithelial / LPF 0-5 (*)    Bacteria, UA FEW (*)    All other components within normal limits  PREGNANCY, URINE  HIV ANTIBODY (ROUTINE TESTING)  GC/CHLAMYDIA PROBE AMP ([place]) NOT AT [facility]    Imaging Review No results found.    EKG Interpretation None      MDM   Final diagnoses:  Cervicitis  Bacterial vaginosis   Pt presenting c/o lower abdominal pain and vaginal discharge.  Urine shows many wbcs, but few bacteria and neg nitrite.  Suspect this is from vaginal source.  Pelvic exam with CMT or adnexal tenderness to suggest PID, but there are clue cells and WBCs- pt treated for cervicitis and BV.  genprobe pending.  Discharged with strict return precautions.  Pt agreeable with plan.   I personally performed the services described in this documentation, which was scribed in my presence. The recorded information has been reviewed and is accurate.       Jerelyn Scott, MD 01/30/16 [phone]

## 2016-01-30 NOTE — ED Notes (Signed)
1 week of lower abd pain and vaginal discharge

## 2016-01-30 NOTE — Discharge Instructions (Signed)
Return to the ED with any concerns including fever/chills, vomiting and not able to keep down liquids or antibiotics, decreased level of alertness/lethargy, or any other alarming symptoms °

## 2016-02-01 LAB — GC/CHLAMYDIA PROBE AMP (~~LOC~~) NOT AT ARMC
Chlamydia: NEGATIVE
Neisseria Gonorrhea: POSITIVE — AB

## 2016-02-01 LAB — HIV ANTIBODY (ROUTINE TESTING W REFLEX): HIV SCREEN 4TH GENERATION: NONREACTIVE

## 2016-02-02 ENCOUNTER — Telehealth (HOSPITAL_BASED_OUTPATIENT_CLINIC_OR_DEPARTMENT_OTHER): Payer: Self-pay | Admitting: Emergency Medicine

## 2016-02-27 ENCOUNTER — Encounter (HOSPITAL_BASED_OUTPATIENT_CLINIC_OR_DEPARTMENT_OTHER): Payer: Self-pay | Admitting: Emergency Medicine

## 2016-02-27 ENCOUNTER — Emergency Department (HOSPITAL_BASED_OUTPATIENT_CLINIC_OR_DEPARTMENT_OTHER)
Admission: EM | Admit: 2016-02-27 | Discharge: 2016-02-28 | Disposition: A | Discharge status: Home / Self Care | Payer: Medicaid Other | Attending: Emergency Medicine | Admitting: Emergency Medicine

## 2016-02-27 DIAGNOSIS — B9689 Other specified bacterial agents as the cause of diseases classified elsewhere: Secondary | ICD-10-CM

## 2016-02-27 DIAGNOSIS — N898 Other specified noninflammatory disorders of vagina: Secondary | ICD-10-CM

## 2016-02-27 DIAGNOSIS — Z8619 Personal history of other infectious and parasitic diseases: Secondary | ICD-10-CM | POA: Insufficient documentation

## 2016-02-27 DIAGNOSIS — N760 Acute vaginitis: Secondary | ICD-10-CM | POA: Insufficient documentation

## 2016-02-27 DIAGNOSIS — Z3202 Encounter for pregnancy test, result negative: Secondary | ICD-10-CM | POA: Diagnosis not present

## 2016-02-27 DIAGNOSIS — F17210 Nicotine dependence, cigarettes, uncomplicated: Secondary | ICD-10-CM | POA: Insufficient documentation

## 2016-02-27 LAB — URINALYSIS, ROUTINE W REFLEX MICROSCOPIC
Bilirubin Urine: NEGATIVE
Glucose, UA: NEGATIVE mg/dL
HGB URINE DIPSTICK: NEGATIVE
Ketones, ur: NEGATIVE mg/dL
NITRITE: NEGATIVE
PH: 6 (ref 5.0–8.0)
PROTEIN: NEGATIVE mg/dL
Specific Gravity, Urine: 1.025 (ref 1.005–1.030)

## 2016-02-27 LAB — URINE MICROSCOPIC-ADD ON: RBC / HPF: NONE SEEN RBC/hpf (ref 0–5)

## 2016-02-27 LAB — PREGNANCY, URINE: Preg Test, Ur: NEGATIVE

## 2016-02-27 MED ORDER — AZITHROMYCIN 250 MG PO TABS
1000.0000 mg | ORAL_TABLET | Freq: Once | ORAL | Status: AC
  Administered 2016-02-27: 1000 mg via ORAL
  Filled 2016-02-27: qty 4

## 2016-02-27 MED ORDER — CEFTRIAXONE SODIUM 250 MG IJ SOLR
250.0000 mg | Freq: Once | INTRAMUSCULAR | Status: AC
  Administered 2016-02-27: 250 mg via INTRAMUSCULAR
  Filled 2016-02-27: qty 250

## 2016-02-27 MED ORDER — LIDOCAINE HCL (PF) 1 % IJ SOLN
INTRAMUSCULAR | Status: AC
  Administered 2016-02-27
  Filled 2016-02-27: qty 5

## 2016-02-27 NOTE — ED Notes (Signed)
Pt in c/o pelvic pain and vaginal discharge onset 7 days ago, hx of BV.

## 2016-02-27 NOTE — ED Provider Notes (Signed)
CSN: [id number]     Arrival date & time 02/27/16  2103 History   First MD Initiated Contact with Patient 02/27/16 2226     Chief Complaint  Patient presents with  . Pelvic Pain  . Vaginal Discharge     (Consider location/radiation/quality/duration/timing/severity/associated sxs/prior Treatment) HPI   35 year old female with prior history of STD presents for evaluation of pelvic pain and vaginal discharge. Pt sts 1 month ago she was seen in the ER for vaginal discharge and was treated for BV and Gonorrhea/Chlamydia.  Pt informed her only partner to get treated.  Her sxs improves after 2 weeks.  She has sexual intercourse with the same partner and now she developed suprapubic abd pain radiates to her R pelvic region for the past week.  Describe as sharp, 8/10, with dyspareunia.  She also has dysuria, along with white vaginal discharge. Pt denies fever, chills, body aches, n/v/d.  LMP feb 21st.   History reviewed. No pertinent past medical history. History reviewed. No pertinent past surgical history. History reviewed. No pertinent family history. Social History  Substance Use Topics  . Smoking status: Current Some Day Smoker -- 0.50 packs/day    Types: Cigarettes  . Smokeless tobacco: Never Used  . Alcohol Use: Yes     Comment: ocassionally   OB History    No data available     Review of Systems  All other systems reviewed and are negative.     Allergies  Review of patient's allergies indicates no known allergies.  Home Medications   Prior to Admission medications   Medication Sig Start Date End Date Taking? Authorizing Provider  metroNIDAZOLE (FLAGYL) 500 MG tablet Take 1 tablet (500 mg total) by mouth 2 (two) times daily. 01/30/16   Jerelyn Scott, MD   BP 122/88 mmHg  Pulse 82  Temp(Src) 98.2 F (36.8 C) (Oral)  Resp 18  Ht  (1.651 m)  Wt 70.308 kg  BMI 25.79 kg/m2  SpO2 100%  LMP 01/08/2016 Physical Exam  Constitutional: She appears well-developed and  well-nourished. No distress.  African-American female resting in bed in no acute discomfort, nontoxic  HENT:  Head: Atraumatic.  Eyes: Conjunctivae are normal.  Neck: Neck supple.  Abdominal: Soft. There is tenderness (Mild suprapubic tenderness without guarding or rebound tenderness. Negative Murphy sign, no pain at McBurney's point.).  Genitourinary:  Chaperone present during exam. No inguinal lymphadenopathy or inguinal hernia noted. Normal external genitalia. No pain with speculum insertion. Vaginal vault with moderate amount of white curd-like discharge with odor. Closed cervical os was visualized with mild yellow discharge. On bimanual examination, no adnexal tenderness or cervical motion tenderness.  Neurological: She is alert.  Skin: No rash noted.  Psychiatric: She has a normal mood and affect.  Nursing note and vitals reviewed.   ED Course  Procedures (including critical care time) Labs Review Labs Reviewed  WET PREP, GENITAL - Abnormal; Notable for the following:    Clue Cells Wet Prep HPF POC PRESENT (*)    WBC, Wet Prep HPF POC MODERATE (*)    All other components within normal limits  URINALYSIS, ROUTINE W REFLEX MICROSCOPIC (NOT AT [facility]) - Abnormal; Notable for the following:    APPearance CLOUDY (*)    Leukocytes, UA SMALL (*)    All other components within normal limits  URINE MICROSCOPIC-ADD ON - Abnormal; Notable for the following:    Squamous Epithelial / LPF 6-30 (*)    Bacteria, UA RARE (*)    All other  components within normal limits  PREGNANCY, URINE  RPR  GC/CHLAMYDIA PROBE AMP ([place]) NOT AT [facility]    Imaging Review No results found. I have personally reviewed and evaluated these images and lab results as part of my medical decision-making.   EKG Interpretation None      MDM   Final diagnoses:  Vaginal discharge  BV (bacterial vaginosis)    BP 122/88 mmHg  Pulse 82  Temp(Src) 98.2 F (36.8 C) (Oral)  Resp 18  Ht 5\' 5"  (1.651 m)   Wt 70.308 kg  BMI 25.79 kg/m2  SpO2 100%  LMP 01/08/2016   Patient with recurrent vaginal discharge and pelvic pain, treated for positive gonorrhea and bacterial vaginosis a month ago and she has similar symptoms. She is sexually active with the same partner.  11:31 PM Patient does have moderate amount of vaginal discharge on exam however she does not have any significant pelvic discomfort on pelvic exam to suggest PID. Patient agrees to receive prophylactic antibiotics including Rocephin and Zithromax here.  12:18 AM Wet prep shown evidence of bacterial vaginosis and moderate WBC. She has received prophylactic STD treatment already. She will receive metronidazole as treatment for BV. Patient does not douche but states that she does use fragrance soaps and tampon which I recommend avoiding as it may cause recurrent BV. Patient will need to avoid alcohol use while taking metronidazole. Return precaution discussed. Patient is stable for discharge.  Fayrene HelperBowie Jaren Kearn, PA-C 02/28/16 0020  Marily MemosJason Mesner, MD 03/02/16 1550

## 2016-02-28 LAB — WET PREP, GENITAL
Sperm: NONE SEEN
TRICH WET PREP: NONE SEEN
Yeast Wet Prep HPF POC: NONE SEEN

## 2016-02-28 MED ORDER — METRONIDAZOLE 500 MG PO TABS: 500.0000 mg | ORAL_TABLET | Freq: Two times a day (BID) | ORAL | Status: DC

## 2016-02-28 NOTE — Discharge Instructions (Signed)

## 2016-02-29 LAB — GC/CHLAMYDIA PROBE AMP (~~LOC~~) NOT AT ARMC
Chlamydia: NEGATIVE
Neisseria Gonorrhea: NEGATIVE

## 2016-02-29 LAB — RPR: RPR: NONREACTIVE

## 2016-10-11 ENCOUNTER — Emergency Department (HOSPITAL_BASED_OUTPATIENT_CLINIC_OR_DEPARTMENT_OTHER)
Admission: EM | Admit: 2016-10-11 | Discharge: 2016-10-11 | Disposition: A | Discharge status: Home / Self Care | Payer: Medicaid Other | Attending: Physician Assistant | Admitting: Physician Assistant

## 2016-10-11 ENCOUNTER — Encounter (HOSPITAL_BASED_OUTPATIENT_CLINIC_OR_DEPARTMENT_OTHER): Payer: Self-pay

## 2016-10-11 DIAGNOSIS — A599 Trichomoniasis, unspecified: Secondary | ICD-10-CM

## 2016-10-11 DIAGNOSIS — Z87891 Personal history of nicotine dependence: Secondary | ICD-10-CM | POA: Insufficient documentation

## 2016-10-11 DIAGNOSIS — B9689 Other specified bacterial agents as the cause of diseases classified elsewhere: Secondary | ICD-10-CM

## 2016-10-11 DIAGNOSIS — A5901 Trichomonal vulvovaginitis: Secondary | ICD-10-CM | POA: Insufficient documentation

## 2016-10-11 DIAGNOSIS — N760 Acute vaginitis: Secondary | ICD-10-CM

## 2016-10-11 HISTORY — DX: Acute vaginitis: N76.0

## 2016-10-11 HISTORY — DX: Other specified bacterial agents as the cause of diseases classified elsewhere: B96.89

## 2016-10-11 LAB — URINE MICROSCOPIC-ADD ON: RBC / HPF: NONE SEEN RBC/hpf (ref 0–5)

## 2016-10-11 LAB — WET PREP, GENITAL
SPERM: NONE SEEN
Yeast Wet Prep HPF POC: NONE SEEN

## 2016-10-11 LAB — URINALYSIS, ROUTINE W REFLEX MICROSCOPIC
Bilirubin Urine: NEGATIVE
GLUCOSE, UA: NEGATIVE mg/dL
Hgb urine dipstick: NEGATIVE
KETONES UR: NEGATIVE mg/dL
NITRITE: NEGATIVE
PROTEIN: NEGATIVE mg/dL
Specific Gravity, Urine: 1.026 (ref 1.005–1.030)
pH: 7 (ref 5.0–8.0)

## 2016-10-11 LAB — PREGNANCY, URINE: PREG TEST UR: NEGATIVE

## 2016-10-11 MED ORDER — METRONIDAZOLE 500 MG PO TABS
500.0000 mg | ORAL_TABLET | Freq: Two times a day (BID) | ORAL | 0 refills | Status: DC
Start: 2016-10-11 — End: 2017-04-24

## 2016-10-11 MED ORDER — METRONIDAZOLE 500 MG PO TABS
500.0000 mg | ORAL_TABLET | Freq: Once | ORAL | Status: AC
  Administered 2016-10-11: 500 mg via ORAL
  Filled 2016-10-11: qty 1

## 2016-10-11 NOTE — ED Triage Notes (Signed)
C/o dysuria and vaginal d/c x 3 days-NAD-steady gait

## 2016-10-11 NOTE — Discharge Instructions (Signed)
You have not have both trichomoniasis and bacterial vaginosis. Please take the antibiotics help you feel better. Please return as needed.

## 2016-10-11 NOTE — ED Provider Notes (Signed)
[facility] Provider Note   CSN: [id number] Arrival date & time: 10/11/16  1520     History   Chief Complaint Chief Complaint  Patient presents with  . Dysuria    HPI Rebecca Bradley is a [age] y.o. female.  HPI   Patient is a 35 year old female presenting with vaginal itching and vaginal discharge. Patient reports that she often battles with bacterial vaginosis. She reports she went to the beach on Saturday and had sand in her vagina and drove all the way home. Since then she's had increasing smelling in her vagina as well as discharge.  She's had burning with urination and frequency.  Past Medical History:  Diagnosis Date  . BV (bacterial vaginosis)     There are no active problems to display for this patient.   History reviewed. No pertinent surgical history.  OB History    No data available       Home Medications    Prior to Admission medications   Not on File    Family History No family history on file.  Social History Social History  Substance Use Topics  . Smoking status: Former Smoker    Packs/day: 0.00  . Smokeless tobacco: Never Used  . Alcohol use Yes     Comment: ocassionally     Allergies   Review of patient's allergies indicates no known allergies.   Review of Systems Review of Systems  Constitutional: Negative for activity change.  Respiratory: Negative for shortness of breath.   Cardiovascular: Negative for chest pain.  Gastrointestinal: Negative for abdominal pain.  Genitourinary: Positive for difficulty urinating, dysuria, frequency and vaginal discharge.  All other systems reviewed and are negative.    Physical Exam Updated Vital Signs BP 147/95 (BP Location: Left Arm)   Pulse 77   Temp 98.1 F (36.7 C) (Oral)   Resp 18   Ht 5\' 5"  (1.651 m)   Wt 150 lb (68 kg)   LMP 09/18/2016   SpO2 100%   BMI 24.96 kg/m   Physical Exam  Constitutional: She is oriented to person, place, and time. She appears  well-developed and well-nourished.  HENT:  Head: Normocephalic and atraumatic.  Eyes: Right eye exhibits no discharge.  Cardiovascular: Normal rate and regular rhythm.   Pulmonary/Chest: Effort normal.  Genitourinary: Vagina normal.  Genitourinary Comments: discharge  Neurological: She is oriented to person, place, and time.  Skin: Skin is warm and dry. She is not diaphoretic.  Psychiatric: She has a normal mood and affect.  Nursing note and vitals reviewed.    ED Treatments / Results  Labs (all labs ordered are listed, but only abnormal results are displayed) Labs Reviewed  URINALYSIS, ROUTINE W REFLEX MICROSCOPIC (NOT AT [facility]) - Abnormal; Notable for the following:       Result Value   Leukocytes, UA SMALL (*)    All other components within normal limits  URINE MICROSCOPIC-ADD ON - Abnormal; Notable for the following:    Squamous Epithelial / LPF 6-30 (*)    Bacteria, UA FEW (*)    All other components within normal limits  WET PREP, GENITAL  PREGNANCY, URINE  GC/CHLAMYDIA PROBE AMP ([place]) NOT AT [facility]    EKG  EKG Interpretation None       Radiology No results found.  Procedures Procedures (including critical care time)  Medications Ordered in ED Medications - No data to display   Initial Impression / Assessment and Plan / ED Course  I have reviewed  the triage vital signs and the nursing notes.  Pertinent labs & imaging results that were available during my care of the patient were reviewed by me and considered in my medical decision making (see chart for details).  Clinical Course    Patient is a 35 year old female presenting with symptoms of vaginal discharge and malodorous discharge. Patient concerned she may have BV again. Urine shows no urinary tract infection. Vaginal exam shows scant discharge.  BV and trich. Will treat accordingly.   Final Clinical Impressions(s) / ED Diagnoses   Final diagnoses:  None    New Prescriptions New  Prescriptions   No medications on file     Ajia Chadderdon Randall AnLyn Cyenna Rebello, MD 10/11/16 2258

## 2016-10-12 LAB — GC/CHLAMYDIA PROBE AMP (~~LOC~~) NOT AT ARMC
Chlamydia: NEGATIVE
Neisseria Gonorrhea: NEGATIVE

## 2017-01-14 ENCOUNTER — Emergency Department (HOSPITAL_BASED_OUTPATIENT_CLINIC_OR_DEPARTMENT_OTHER): Payer: Medicaid Other

## 2017-01-14 ENCOUNTER — Encounter (HOSPITAL_BASED_OUTPATIENT_CLINIC_OR_DEPARTMENT_OTHER): Payer: Self-pay | Admitting: Emergency Medicine

## 2017-01-14 ENCOUNTER — Emergency Department (HOSPITAL_BASED_OUTPATIENT_CLINIC_OR_DEPARTMENT_OTHER)
Admission: EM | Admit: 2017-01-14 | Discharge: 2017-01-14 | Disposition: A | Discharge status: Home / Self Care | Payer: Medicaid Other | Attending: Emergency Medicine | Admitting: Emergency Medicine

## 2017-01-14 DIAGNOSIS — Z79899 Other long term (current) drug therapy: Secondary | ICD-10-CM | POA: Insufficient documentation

## 2017-01-14 DIAGNOSIS — R110 Nausea: Secondary | ICD-10-CM

## 2017-01-14 DIAGNOSIS — F419 Anxiety disorder, unspecified: Secondary | ICD-10-CM | POA: Insufficient documentation

## 2017-01-14 DIAGNOSIS — R61 Generalized hyperhidrosis: Secondary | ICD-10-CM | POA: Insufficient documentation

## 2017-01-14 DIAGNOSIS — Z87891 Personal history of nicotine dependence: Secondary | ICD-10-CM | POA: Insufficient documentation

## 2017-01-14 DIAGNOSIS — R55 Syncope and collapse: Secondary | ICD-10-CM | POA: Insufficient documentation

## 2017-01-14 DIAGNOSIS — R531 Weakness: Secondary | ICD-10-CM | POA: Insufficient documentation

## 2017-01-14 DIAGNOSIS — R112 Nausea with vomiting, unspecified: Secondary | ICD-10-CM | POA: Insufficient documentation

## 2017-01-14 DIAGNOSIS — R002 Palpitations: Secondary | ICD-10-CM | POA: Insufficient documentation

## 2017-01-14 LAB — COMPREHENSIVE METABOLIC PANEL
ALBUMIN: 4.7 g/dL (ref 3.5–5.0)
ALT: 15 U/L (ref 14–54)
ANION GAP: 8 (ref 5–15)
AST: 22 U/L (ref 15–41)
Alkaline Phosphatase: 45 U/L (ref 38–126)
BILIRUBIN TOTAL: 1.5 mg/dL — AB (ref 0.3–1.2)
BUN: 10 mg/dL (ref 6–20)
CHLORIDE: 104 mmol/L (ref 101–111)
CO2: 27 mmol/L (ref 22–32)
Calcium: 9.6 mg/dL (ref 8.9–10.3)
Creatinine, Ser: 0.85 mg/dL (ref 0.44–1.00)
GFR calc Af Amer: >60 mL/min (ref >60)
GFR calc non Af Amer: >60 mL/min (ref >60)
GLUCOSE: 100 mg/dL — AB (ref 65–99)
POTASSIUM: 3.7 mmol/L (ref 3.5–5.1)
SODIUM: 139 mmol/L (ref 135–145)
Total Protein: 7.2 g/dL (ref 6.5–8.1)

## 2017-01-14 LAB — URINALYSIS, ROUTINE W REFLEX MICROSCOPIC
BILIRUBIN URINE: NEGATIVE
GLUCOSE, UA: NEGATIVE mg/dL
KETONES UR: NEGATIVE mg/dL
Leukocytes, UA: NEGATIVE
Nitrite: NEGATIVE
PH: 6 (ref 5.0–8.0)
Protein, ur: NEGATIVE mg/dL
SPECIFIC GRAVITY, URINE: 1.004 — AB (ref 1.005–1.030)

## 2017-01-14 LAB — URINALYSIS, MICROSCOPIC (REFLEX)

## 2017-01-14 LAB — CBC
HEMATOCRIT: 39.7 % (ref 36.0–46.0)
HEMOGLOBIN: 13.8 g/dL (ref 12.0–15.0)
MCH: 31 pg (ref 26.0–34.0)
MCHC: 34.8 g/dL (ref 30.0–36.0)
MCV: 89.2 fL (ref 78.0–100.0)
Platelets: 308 10*3/uL (ref 150–400)
RBC: 4.45 MIL/uL (ref 3.87–5.11)
RDW: 12 % (ref 11.5–15.5)
WBC: 6.3 10*3/uL (ref 4.0–10.5)

## 2017-01-14 LAB — PREGNANCY, URINE: Preg Test, Ur: NEGATIVE

## 2017-01-14 LAB — LIPASE, BLOOD: LIPASE: 22 U/L (ref 11–51)

## 2017-01-14 MED ORDER — ONDANSETRON 4 MG PO TBDP: ORAL_TABLET | ORAL | 0 refills | Status: DC

## 2017-01-14 NOTE — ED Triage Notes (Signed)
Patient states that she has had intermittent passing out and weakness for the last 2 weeks. [place] The patient continues to have weakness and bad taste to food. The patient reports that her hands are sweaty and finger tips are cold. The patient is tearful in triage.

## 2017-01-14 NOTE — Discharge Instructions (Signed)
Follow up with your family doc, you may need a holter monitor.

## 2017-01-14 NOTE — ED Notes (Signed)
Pt discharged to home NAD.  

## 2017-01-14 NOTE — ED Provider Notes (Signed)
[facility] Provider Note   CSN: [id number] Arrival date & time: 01/14/17  1928   By signing my name below, I, Rebecca Bradley, attest that this documentation has been prepared under the direction and in the presence of Melene Plan, DO. Electronically signed, Rebecca Bradley, ED Scribe. 01/14/17. 11:24 PM.   History   Chief Complaint Chief Complaint  Patient presents with  . Emesis   The history is provided by the patient and medical records. No language interpreter was used.    HPI Comments: Rebecca Bradley is a 36 y.o. female who presents to the Emergency Department complaining of episodic syncope and weakness x 2 weeks. Pt seen and treated for similar symptoms out of town > 1 week ago. Pt notes she gives first began losing consciousness after donating plasma. She states her fingertips became cold and her hands became sweaty after smoking a cigarette ~6 days ago. She further notes she went to an ED at the time and she was told she was told she had palpitations and high blood pressure. She adds decreased appetite and N/V 2-3 days, and she states food does not taste good. Pt expresses concern for anxiety, and she reveals she recently quit smoking cannabis.  Past Medical History:  Diagnosis Date  . BV (bacterial vaginosis)     There are no active problems to display for this patient.   History reviewed. No pertinent surgical history.  OB History    No data available       Home Medications    Prior to Admission medications   Medication Sig Start Date End Date Taking? Authorizing Provider  metroNIDAZOLE (FLAGYL) 500 MG tablet Take 1 tablet (500 mg total) by mouth 2 (two) times daily. 10/11/16   Courteney Lyn Mackuen, MD  ondansetron (ZOFRAN ODT) 4 MG disintegrating tablet 4mg  ODT q4 hours prn nausea/vomit 01/14/17   Melene Plan, DO    Family History History reviewed. No pertinent family history.  Social History Social History  Substance Use Topics  . Smoking status:  Former Smoker    Packs/day: 0.00  . Smokeless tobacco: Never Used  . Alcohol use Yes     Comment: ocassionally     Allergies   Patient has no known allergies.   Review of Systems Review of Systems  Constitutional: Positive for activity change, appetite change, chills and diaphoresis. Negative for fever.  HENT: Negative for congestion and rhinorrhea.   Eyes: Negative for redness and visual disturbance.  Respiratory: Negative for shortness of breath and wheezing.   Cardiovascular: Positive for palpitations. Negative for chest pain.  Gastrointestinal: Positive for nausea and vomiting.  Genitourinary: Negative for dysuria and urgency.  Musculoskeletal: Negative for arthralgias and myalgias.  Skin: Negative for pallor and wound.  Neurological: Positive for syncope and weakness. Negative for dizziness and headaches.  Psychiatric/Behavioral: The patient is nervous/anxious.      Physical Exam Updated Vital Signs BP 130/83 (BP Location: Right Arm)   Pulse 72   Temp 98.3 F (36.8 C) (Oral)   Resp 22   Ht 5\' 5"  (1.651 m)   Wt 140 lb (63.5 kg)   LMP 01/13/2017 (Exact Date)   SpO2 99%   BMI 23.30 kg/m   Physical Exam  Constitutional: She is oriented to person, place, and time. She appears well-developed and well-nourished. No distress.  HENT:  Head: Normocephalic and atraumatic.  Eyes: EOM are normal. Pupils are equal, round, and reactive to light.  Neck: Normal range of motion. Neck supple.  Cardiovascular: Normal rate and regular rhythm.  Exam reveals no gallop and no friction rub.   No murmur heard. Pulmonary/Chest: Effort normal. She has no wheezes. She has no rales.  Abdominal: Soft. She exhibits no distension. There is no tenderness.  Musculoskeletal: She exhibits no edema or tenderness.  Neurological: She is alert and oriented to person, place, and time.  Skin: Skin is warm and dry. She is not diaphoretic.  Psychiatric: She has a normal mood and affect. Her behavior  is normal.  Nursing note and vitals reviewed.    ED Treatments / Results  DIAGNOSTIC STUDIES: Oxygen Saturation is 99% on RA, normal by my interpretation.    COORDINATION OF CARE: 11:24 PM Discussed treatment plan with pt at bedside and pt agreed to plan. Will order nausea medications. Pt advised to F/U with PCP.  Labs (all labs ordered are listed, but only abnormal results are displayed) Labs Reviewed  COMPREHENSIVE METABOLIC PANEL - Abnormal; Notable for the following:       Result Value   Glucose, Bld 100 (*)    Total Bilirubin 1.5 (*)    All other components within normal limits  URINALYSIS, ROUTINE W REFLEX MICROSCOPIC - Abnormal; Notable for the following:    Specific Gravity, Urine 1.004 (*)    Hgb urine dipstick SMALL (*)    All other components within normal limits  URINALYSIS, MICROSCOPIC (REFLEX) - Abnormal; Notable for the following:    Bacteria, UA FEW (*)    Squamous Epithelial / LPF 0-5 (*)    All other components within normal limits  LIPASE, BLOOD  CBC  PREGNANCY, URINE    EKG  EKG Interpretation  Date/Time:  Saturday January 14 2017 23:12:20 EST Ventricular Rate:  71 PR Interval:    QRS Duration: 87 QT Interval:  400 QTC Calculation: 435 R Axis:   -10 Text Interpretation:  Sinus rhythm no wpw, prolonged qt or brugada No old tracing to compare Confirmed by Castor Gittleman MD, DANIEL [phone]([id number]) on 01/14/2017 11:33:53 PM       Radiology Dg Chest 2 View  Result Date: 01/14/2017 CLINICAL DATA:  Syncope EXAM: CHEST  2 VIEW COMPARISON:  None. FINDINGS: The heart size and mediastinal contours are within normal limits. Both lungs are clear. The visualized skeletal structures are unremarkable. IMPRESSION: No active cardiopulmonary disease. Electronically Signed   By: Ellery Plunkaniel R Mitchell M.D.   On: 01/14/2017 20:35    Procedures Procedures (including critical care time)  Medications Ordered in ED Medications - No data to display   Initial Impression / Assessment and  Plan / ED Course  I have reviewed the triage vital signs and the nursing notes.  Pertinent labs & imaging results that were available during my care of the patient were reviewed by me and considered in my medical decision making (see chart for details).   36 yo F With a chief complaints of a syncopal event about a week ago and now nausea for the past couple days. Patient denies any abdominal pain had one episode of vomiting yesterday. Is tolerating by mouth. Laboratory workup was performed patient is not anemic she is not pregnant she did not have a urinary tract infection EKG with no specific findings concerning for syncope. Discharged patient home.   11:33 PM:  I have discussed the diagnosis/risks/treatment options with the patient and family and believe the pt to be eligible for discharge home to follow-up with PCP. We also discussed returning to the ED immediately if new or worsening sx occur.  We discussed the sx which are most concerning (e.g., sudden worsening pain, fever, inability to tolerate by mouth) that necessitate immediate return. Medications administered to the patient during their visit and any new prescriptions provided to the patient are listed below.  Medications given during this visit Medications - No data to display   The patient appears reasonably screen and/or stabilized for discharge and I doubt any other medical condition or other [facility] requiring further screening, evaluation, or treatment in the ED at this time prior to discharge.   I personally performed the services described in this documentation, which was scribed in my presence. The recorded information has been reviewed and is accurate.     Final Clinical Impressions(s) / ED Diagnoses   Final diagnoses:  Nausea  Syncope and collapse    New Prescriptions New Prescriptions   ONDANSETRON (ZOFRAN ODT) 4 MG DISINTEGRATING TABLET    4mg  ODT q4 hours prn nausea/vomit     Melene Plan, DO 01/14/17 2334

## 2017-04-24 ENCOUNTER — Emergency Department (HOSPITAL_BASED_OUTPATIENT_CLINIC_OR_DEPARTMENT_OTHER)
Admission: EM | Admit: 2017-04-24 | Discharge: 2017-04-24 | Disposition: A | Discharge status: Home / Self Care | Payer: Medicaid Other | Attending: Emergency Medicine | Admitting: Emergency Medicine

## 2017-04-24 ENCOUNTER — Encounter (HOSPITAL_BASED_OUTPATIENT_CLINIC_OR_DEPARTMENT_OTHER): Payer: Self-pay

## 2017-04-24 DIAGNOSIS — M545 Low back pain, unspecified: Secondary | ICD-10-CM

## 2017-04-24 DIAGNOSIS — N760 Acute vaginitis: Secondary | ICD-10-CM | POA: Insufficient documentation

## 2017-04-24 DIAGNOSIS — B9689 Other specified bacterial agents as the cause of diseases classified elsewhere: Secondary | ICD-10-CM

## 2017-04-24 DIAGNOSIS — Z87891 Personal history of nicotine dependence: Secondary | ICD-10-CM | POA: Insufficient documentation

## 2017-04-24 LAB — URINALYSIS, ROUTINE W REFLEX MICROSCOPIC
Bilirubin Urine: NEGATIVE
Glucose, UA: NEGATIVE mg/dL
Hgb urine dipstick: NEGATIVE
KETONES UR: NEGATIVE mg/dL
LEUKOCYTES UA: NEGATIVE
NITRITE: NEGATIVE
PH: 7 (ref 5.0–8.0)
Protein, ur: NEGATIVE mg/dL
SPECIFIC GRAVITY, URINE: 1.029 (ref 1.005–1.030)

## 2017-04-24 LAB — WET PREP, GENITAL
SPERM: NONE SEEN
TRICH WET PREP: NONE SEEN
Yeast Wet Prep HPF POC: NONE SEEN

## 2017-04-24 LAB — PREGNANCY, URINE: Preg Test, Ur: NEGATIVE

## 2017-04-24 MED ORDER — METHOCARBAMOL 500 MG PO TABS: 500.0000 mg | ORAL_TABLET | Freq: Three times a day (TID) | ORAL | 0 refills | Status: DC | PRN

## 2017-04-24 MED ORDER — METRONIDAZOLE 500 MG PO TABS: 500.0000 mg | ORAL_TABLET | Freq: Two times a day (BID) | ORAL | 0 refills | Status: DC

## 2017-04-24 MED FILL — metroNIDAZOLE 500 MG TABS: 500 | 7 days supply | Qty: 14 | Fill #0

## 2017-04-24 MED FILL — METHOCARBAMOL 500 MG TABLET: 500 | 3 days supply | Qty: 10 | Fill #0

## 2017-04-24 NOTE — ED Provider Notes (Signed)
[facility]   CSN: [id number] Arrival date & time: 04/24/17  1443  By signing my name below, I, Linna Darner, attest that this documentation has been prepared under the direction and in the presence of physician practitioner, Benjiman Core, MD. Electronically Signed: Linna Darner, Scribe. 04/24/2017. 3:23 PM.  History   Chief Complaint Chief Complaint  Patient presents with  . Back Pain   The history is provided by the patient. No language interpreter was used.    HPI Comments: Rebecca Bradley is a 36 y.o. female who presents to the Emergency Department complaining of constant, gradually worsening, non-radiating lower back pain for about one week. Patient denies any trauma to her back or recent over-exertion. She has tried ibuprofen with transient improvement of her pain. She works as a Advertising copywriter and bends frequently which she believes is contributing to the persistence of her back pain. No h/o IVDU. She denies urinary/bowel incontinence, focal weakness, numbness/tingling, or any other associated symptoms.  She is also complaining of intermittent vaginal discharge for a few days. She has a history of bacterial vaginosis and trichomonas and states her current discharge is somewhat similar to that which she experienced with these conditions. Patient states her discharge is not malodorous. Patient has not had sexual intercourse since December 2017. She denies vaginal pain, rashes, or any other associated symptoms.  Past Medical History:  Diagnosis Date  . BV (bacterial vaginosis)     There are no active problems to display for this patient.   History reviewed. No pertinent surgical history.  OB History    No data available       Home Medications    Prior to Admission medications   Not on File    Family History No family history on file.  Social History Social History  Substance Use Topics  . Smoking status: Former Smoker    Packs/day:  0.00  . Smokeless tobacco: Never Used  . Alcohol use No     Allergies   Patient has no known allergies.   Review of Systems Review of Systems  Gastrointestinal:       Negative for bowel incontinence.  Genitourinary: Positive for vaginal discharge. Negative for vaginal pain.       Negative for urinary incontinence.  Musculoskeletal: Positive for back pain.  Skin: Negative for rash.  Neurological: Negative for weakness and numbness.   Physical Exam Updated Vital Signs BP 116/85 (BP Location: Left Arm)   Pulse 86   Temp 98.9 F (37.2 C) (Oral)   Resp 16   Ht 5\' 5"  (1.651 m)   Wt 149 lb 4 oz (67.7 kg)   LMP 04/03/2017   SpO2 100%   BMI 24.84 kg/m   Physical Exam  Constitutional: She is oriented to person, place, and time. She appears well-developed and well-nourished. No distress.  HENT:  Head: Normocephalic.  Neck: Neck supple. No tracheal deviation present.  Cardiovascular: Normal rate and regular rhythm.   Pulmonary/Chest: Effort normal and breath sounds normal. No respiratory distress.  Abdominal: There is no tenderness.  Genitourinary:  Genitourinary Comments: White somewhat foamy vaginal discharge. No odor. No cervical motion tenderness. No adnexal tenderness. No skin changes.  Musculoskeletal: Normal range of motion. She exhibits tenderness.  Mild tenderness in the lower lumbar and bilateral SI area which is worse on the right. Good straight leg raises bilaterally.  Neurological: She is alert and oriented to person, place, and time.  Neurovascularly intact distally.  Skin: Skin is warm  and dry.  Psychiatric: She has a normal mood and affect. Her behavior is normal.  Nursing note and vitals reviewed.  ED Treatments / Results  Labs (all labs ordered are listed, but only abnormal results are displayed) Labs Reviewed  URINALYSIS, ROUTINE W REFLEX MICROSCOPIC - Abnormal; Notable for the following:       Result Value   APPearance CLOUDY (*)    All other  components within normal limits  WET PREP, GENITAL  PREGNANCY, URINE  HIV ANTIBODY (ROUTINE TESTING)  RPR  GC/CHLAMYDIA PROBE AMP ([place]    EKG  EKG Interpretation None       Radiology No results found.  Procedures Procedures (including critical care time)  DIAGNOSTIC STUDIES: Oxygen Saturation is 100% on RA, normal by my interpretation.    COORDINATION OF CARE: 3:22 PM Discussed treatment plan with pt at bedside and pt agreed to plan.  Medications Ordered in ED Medications - No data to display   Initial Impression / Assessment and Plan / ED Course  I have reviewed the triage vital signs and the nursing notes.  Pertinent labs & imaging results that were available during my care of the patient were reviewed by me and considered in my medical decision making (see chart for details).     Patient with low back pain. Likely muscle skeletal. Reproducible. Also vaginal discharge. Reviewed records and she has had clue cells on her wet prep every time she has been here. Unsure of if this is colonization and after discussion with the patient we will empirically treat but she will follow-up with gynecology.  Final Clinical Impressions(s) / ED Diagnoses   Final diagnoses:  None    New Prescriptions New Prescriptions   No medications on file  I personally performed the services described in this documentation, which was scribed in my presence. The recorded information has been reviewed and is accurate.      Benjiman CorePickering, Andrianna Manalang, MD 04/24/17 [phone]

## 2017-04-24 NOTE — ED Notes (Signed)
Pelvic completed.

## 2017-04-24 NOTE — Discharge Instructions (Signed)
Follow-up your gynecologist for further evaluation of your possible bacterial vaginosis. Take Motrin Tylenol along with a muscle relaxer for back pain.

## 2017-04-24 NOTE — ED Triage Notes (Signed)
c/o lower back pain x 1 week-vaginal d/c x 3 days-NAD-slow steady gait

## 2017-04-25 LAB — GC/CHLAMYDIA PROBE AMP (~~LOC~~) NOT AT ARMC
Chlamydia: NEGATIVE
Neisseria Gonorrhea: NEGATIVE

## 2017-04-25 LAB — RPR: RPR Ser Ql: NONREACTIVE

## 2017-04-25 LAB — HIV ANTIBODY (ROUTINE TESTING W REFLEX): HIV SCREEN 4TH GENERATION: NONREACTIVE

## 2017-10-20 ENCOUNTER — Emergency Department (HOSPITAL_BASED_OUTPATIENT_CLINIC_OR_DEPARTMENT_OTHER): Payer: Managed Care, Other (non HMO)

## 2017-10-20 ENCOUNTER — Encounter (HOSPITAL_BASED_OUTPATIENT_CLINIC_OR_DEPARTMENT_OTHER): Payer: Self-pay

## 2017-10-20 ENCOUNTER — Emergency Department (HOSPITAL_BASED_OUTPATIENT_CLINIC_OR_DEPARTMENT_OTHER)
Admission: EM | Admit: 2017-10-20 | Discharge: 2017-10-20 | Disposition: A | Discharge status: Home / Self Care | Payer: Managed Care, Other (non HMO) | Attending: Emergency Medicine | Admitting: Emergency Medicine

## 2017-10-20 ENCOUNTER — Other Ambulatory Visit: Payer: Self-pay

## 2017-10-20 DIAGNOSIS — F17200 Nicotine dependence, unspecified, uncomplicated: Secondary | ICD-10-CM | POA: Diagnosis not present

## 2017-10-20 DIAGNOSIS — R079 Chest pain, unspecified: Secondary | ICD-10-CM | POA: Diagnosis not present

## 2017-10-20 NOTE — ED Provider Notes (Signed)
[facility] EMERGENCY DEPARTMENT Provider Note   CSN: [id number] Arrival date & time: 10/20/17  1814     History   Chief Complaint Chief Complaint  Patient presents with  . Chest Pain    HPI Rebecca Bradley is a 36 y.o. female.  HPI Patient presents with chest pain.  Began last couple hours.  Worse with certain movements.  Feels a little pain with breathing but then states it feels better after deep breath.  No cough.  No change with eating.  No swelling in her legs.  No fevers or chills.  No trauma.  She has not had pains like this before.  She is not on hormonal therapy. Past Medical History:  Diagnosis Date  . BV (bacterial vaginosis)     There are no active problems to display for this patient.   History reviewed. No pertinent surgical history.  OB History    No data available       Home Medications    Prior to Admission medications   Not on File    Family History No family history on file.  Social History Social History   Tobacco Use  . Smoking status: Current Every Day Smoker    Packs/day: 0.00  . Smokeless tobacco: Never Used  Substance Use Topics  . Alcohol use: Yes    Comment: occ  . Drug use: No     Allergies   Patient has no known allergies.   Review of Systems Review of Systems  Constitutional: Negative for appetite change.  HENT: Negative for congestion.   Respiratory: Negative for cough and shortness of breath.   Cardiovascular: Positive for chest pain. Negative for palpitations and leg swelling.  Gastrointestinal: Negative for abdominal pain.  Genitourinary: Negative for flank pain.  Musculoskeletal: Negative for back pain.  Skin: Negative for rash.  Neurological: Negative for syncope.  Hematological: Negative for adenopathy.  Psychiatric/Behavioral: Negative for confusion.     Physical Exam Updated Vital Signs BP 131/86 (BP Location: Left Arm)   Pulse 82   Temp 98.3 F (36.8 C) (Oral)   Resp 18   Ht 5\' 5"   (1.651 m)   Wt 71.3 kg (157 lb 3 oz)   LMP 10/14/2017   SpO2 100%   BMI 26.16 kg/m   Physical Exam  Constitutional: She appears well-developed.  Eyes: Pupils are equal, round, and reactive to light.  Cardiovascular: Normal rate, regular rhythm and normal pulses.  Pulmonary/Chest:  Mild tenderness over lower anterior mid chest wall.  No rash.  No crepitance or deformity.  Musculoskeletal:       Right lower leg: She exhibits no edema.       Left lower leg: She exhibits no edema.  Neurological: She is alert.  Skin: Skin is warm. Capillary refill takes less than 2 seconds.     ED Treatments / Results  Labs (all labs ordered are listed, but only abnormal results are displayed) Labs Reviewed - No data to display  EKG  EKG Interpretation  Date/Time:  Friday October 20 2017 18:25:55 EST Ventricular Rate:  72 PR Interval:    QRS Duration: 90 QT Interval:  396 QTC Calculation: 434 R Axis:   -21 Text Interpretation:  Sinus rhythm Borderline left axis deviation No significant change since last tracing Confirmed by Benjiman CorePickering, Euan Wandler [phone]([id number]) on 10/20/2017 6:39:56 PM       Radiology Dg Chest 2 View  Result Date: 10/20/2017 CLINICAL DATA:  Acute onset of chest pain and cough x3  hours. EXAM: CHEST  2 VIEW COMPARISON:  01/14/2017 FINDINGS: The heart size and mediastinal contours are within normal limits. Both lungs are clear. The visualized skeletal structures are unremarkable. IMPRESSION: No active cardiopulmonary disease. Electronically Signed   By: Tollie Ethavid  Kwon M.D.   On: 10/20/2017 19:23    Procedures Procedures (including critical care time)  Medications Ordered in ED Medications - No data to display   Initial Impression / Assessment and Plan / ED Course  I have reviewed the triage vital signs and the nursing notes.  Pertinent labs & imaging results that were available during my care of the patient were reviewed by me and considered in my medical decision making (see chart  for details).     Patient with chest pain.  Mid chest.  Reproducible.  No swelling her legs. PERC negative.  EKG reassuring.  Doubt cardiac cause.  X-ray reassuring.  Potentially could be chest wall pain.  Will discharge home. Final Clinical Impressions(s) / ED Diagnoses   Final diagnoses:  Nonspecific chest pain    ED Discharge Orders    None       Benjiman CorePickering, Ronnald Shedden, MD 10/20/17 [phone]

## 2017-10-20 NOTE — ED Triage Notes (Addendum)
C/o PC x 2 hours-states pain is worse with movement and deep breaths-NAD-steady gait

## 2017-10-29 ENCOUNTER — Encounter (HOSPITAL_BASED_OUTPATIENT_CLINIC_OR_DEPARTMENT_OTHER): Payer: Self-pay | Admitting: *Deleted

## 2017-10-29 ENCOUNTER — Emergency Department (HOSPITAL_BASED_OUTPATIENT_CLINIC_OR_DEPARTMENT_OTHER)
Admission: EM | Admit: 2017-10-29 | Discharge: 2017-10-30 | Disposition: A | Discharge status: Home / Self Care | Payer: Managed Care, Other (non HMO) | Attending: Emergency Medicine | Admitting: Emergency Medicine

## 2017-10-29 ENCOUNTER — Other Ambulatory Visit: Payer: Self-pay

## 2017-10-29 DIAGNOSIS — R1030 Lower abdominal pain, unspecified: Secondary | ICD-10-CM | POA: Diagnosis not present

## 2017-10-29 DIAGNOSIS — N898 Other specified noninflammatory disorders of vagina: Secondary | ICD-10-CM | POA: Diagnosis not present

## 2017-10-29 DIAGNOSIS — Z711 Person with feared health complaint in whom no diagnosis is made: Secondary | ICD-10-CM | POA: Diagnosis not present

## 2017-10-29 DIAGNOSIS — F17200 Nicotine dependence, unspecified, uncomplicated: Secondary | ICD-10-CM | POA: Diagnosis not present

## 2017-10-29 LAB — URINALYSIS, ROUTINE W REFLEX MICROSCOPIC
Bilirubin Urine: NEGATIVE
Glucose, UA: NEGATIVE mg/dL
Ketones, ur: NEGATIVE mg/dL
LEUKOCYTES UA: NEGATIVE
Nitrite: NEGATIVE
PROTEIN: NEGATIVE mg/dL
Specific Gravity, Urine: 1.025 (ref 1.005–1.030)
pH: 6.5 (ref 5.0–8.0)

## 2017-10-29 LAB — URINALYSIS, MICROSCOPIC (REFLEX)

## 2017-10-29 LAB — PREGNANCY, URINE: PREG TEST UR: NEGATIVE

## 2017-10-29 LAB — WET PREP, GENITAL
SPERM: NONE SEEN
Trich, Wet Prep: NONE SEEN
Yeast Wet Prep HPF POC: NONE SEEN

## 2017-10-29 MED ORDER — AZITHROMYCIN 250 MG PO TABS
1000.0000 mg | ORAL_TABLET | Freq: Once | ORAL | Status: AC
  Administered 2017-10-30: 1000 mg via ORAL
  Filled 2017-10-29: qty 4

## 2017-10-29 MED ORDER — CEFTRIAXONE SODIUM 250 MG IJ SOLR
250.0000 mg | Freq: Once | INTRAMUSCULAR | Status: AC
  Administered 2017-10-30: 250 mg via INTRAMUSCULAR
  Filled 2017-10-29: qty 250

## 2017-10-29 MED ORDER — IBUPROFEN 800 MG PO TABS
800.0000 mg | ORAL_TABLET | Freq: Once | ORAL | Status: AC
  Administered 2017-10-29: 800 mg via ORAL
  Filled 2017-10-29: qty 1

## 2017-10-29 NOTE — ED Triage Notes (Signed)
Abdominal pain and vaginal discharge x 2 days

## 2017-10-29 NOTE — ED Provider Notes (Signed)
[facility] EMERGENCY DEPARTMENT Provider Note   CSN: [id number] Arrival date & time: 10/29/17  2225     History   Chief Complaint Chief Complaint  Patient presents with  . Abdominal Pain    HPI Rebecca Bradley is a 36 y.o. female.  HPI  This is a 36 year old female who presents with vaginal discharge and lower abdominal pain.  Patient reports 2-day history of waxing and waning lower abdominal pain.  She states that it is sharp and in the middle.  Currently she is pain-free.  It does not seem to be associated with anything.  She reports increased vaginal discharge over the last 2 days.  She denies any new sexual partners and states that she normally uses condoms.  However, she would like to be treated for STDs.  She denies any dysuria but states "this could feel like a UTI."  She denies any nausea, vomiting, diarrhea, fevers.  Past Medical History:  Diagnosis Date  . BV (bacterial vaginosis)     There are no active problems to display for this patient.   History reviewed. No pertinent surgical history.  OB History    No data available       Home Medications    Prior to Admission medications   Medication Sig Start Date End Date Taking? Authorizing Provider  naproxen (NAPROSYN) 500 MG tablet Take 1 tablet (500 mg total) 2 (two) times daily by mouth. 10/30/17   Horton, Mayer Maskerourtney F, MD    Family History No family history on file.  Social History Social History   Tobacco Use  . Smoking status: Current Some Day Smoker    Packs/day: 0.00  . Smokeless tobacco: Never Used  Substance Use Topics  . Alcohol use: Yes    Comment: occ  . Drug use: No     Allergies   Patient has no known allergies.   Review of Systems Review of Systems  Constitutional: Negative for fever.  Respiratory: Negative for shortness of breath.   Cardiovascular: Negative for chest pain.  Gastrointestinal: Positive for abdominal pain. Negative for nausea and vomiting.    Genitourinary: Positive for vaginal discharge. Negative for dysuria.  All other systems reviewed and are negative.    Physical Exam Updated Vital Signs BP 128/86 (BP Location: Left Arm)   Pulse 79   Temp 98.7 F (37.1 C) (Oral)   Resp 18   LMP 10/14/2017   SpO2 100%   Physical Exam  Constitutional: She is oriented to person, place, and time. She appears well-developed and well-nourished. No distress.  HENT:  Head: Normocephalic and atraumatic.  Neck: Neck supple.  Cardiovascular: Normal rate, regular rhythm and normal heart sounds.  Pulmonary/Chest: Effort normal. No respiratory distress. She has no wheezes.  Abdominal: Soft. Normal appearance and bowel sounds are normal. There is no rebound and no guarding.  Genitourinary: Uterus is not tender. Cervix exhibits no motion tenderness. No tenderness in the vagina. Vaginal discharge found.  Genitourinary Comments: Normal external vaginal exam, moderate white vaginal discharge noted, no cervical motion tenderness, no adnexal mass or tenderness  Neurological: She is alert and oriented to person, place, and time.  Skin: Skin is warm and dry.  Psychiatric: She has a normal mood and affect.  Nursing note and vitals reviewed.    ED Treatments / Results  Labs (all labs ordered are listed, but only abnormal results are displayed) Labs Reviewed  WET PREP, GENITAL - Abnormal; Notable for the following components:  Result Value   Clue Cells Wet Prep HPF POC PRESENT (*)    WBC, Wet Prep HPF POC MODERATE (*)    All other components within normal limits  URINALYSIS, ROUTINE W REFLEX MICROSCOPIC - Abnormal; Notable for the following components:   APPearance CLOUDY (*)    Hgb urine dipstick TRACE (*)    All other components within normal limits  URINALYSIS, MICROSCOPIC (REFLEX) - Abnormal; Notable for the following components:   Bacteria, UA MANY (*)    Squamous Epithelial / LPF 6-30 (*)    All other components within normal limits   PREGNANCY, URINE  GC/CHLAMYDIA PROBE AMP ([place]) NOT AT [facility]    EKG  EKG Interpretation None       Radiology No results found.  Procedures Procedures (including critical care time)  Medications Ordered in ED Medications  ibuprofen (ADVIL,MOTRIN) tablet 800 mg (800 mg Oral Given 10/29/17 2346)  cefTRIAXone (ROCEPHIN) injection 250 mg (250 mg Intramuscular Given 10/30/17 0003)  azithromycin (ZITHROMAX) tablet 1,000 mg (1,000 mg Oral Given 10/30/17 0002)     Initial Impression / Assessment and Plan / ED Course  I have reviewed the triage vital signs and the nursing notes.  Pertinent labs & imaging results that were available during my care of the patient were reviewed by me and considered in my medical decision making (see chart for details).     She presents with intermittent lower abdominal pain and concern for STDs.  Pain does not lateralize.  She has benign abdominal exam.  Vital signs are stable.  Patient was tested and treated for STDs.  Urinalysis without obvious urinary tract infection.  She is not pregnant.  She has been pain-free while in the emergency department.  At this time do not feel she warrants further workup in the ED.  Doubt ovarian torsion or tubo-ovarian abscess given benign exam.  Follow[facility] at [facility].  She was counseled to abstain from sexual activity for the next 10 days.  She was also counseled regarding safe sex practices.  After history, exam, and medical workup I feel the patient has been appropriately medically screened and is safe for discharge home. Pertinent diagnoses were discussed with the patient. Patient was given return precautions.   Final Clinical Impressions(s) / ED Diagnoses   Final diagnoses:  Concern about STD in female without diagnosis  Lower abdominal pain    ED Discharge Orders        Ordered    naproxen (NAPROSYN) 500 MG tablet  2 times daily     10/30/17 0035       Shon BatonHorton, Courtney F, MD 10/30/17  [phone]

## 2017-10-29 NOTE — ED Notes (Signed)
ED Provider at bedside. 

## 2017-10-30 DIAGNOSIS — R1030 Lower abdominal pain, unspecified: Secondary | ICD-10-CM | POA: Diagnosis not present

## 2017-10-30 MED ORDER — NAPROXEN 500 MG PO TABS: 500.0000 mg | ORAL_TABLET | Freq: Two times a day (BID) | ORAL | 0 refills | Status: DC

## 2017-10-30 NOTE — Discharge Instructions (Signed)
You are seen today for intermittent lower abdominal pain and concerns for STDs.  Your workup is fairly reassuring.  There is no indication for ultrasound at this time.  However he had persistent symptoms, you may need further workup and imaging.  Follow[facility]up with [facility].

## 2017-10-31 LAB — GC/CHLAMYDIA PROBE AMP (~~LOC~~) NOT AT ARMC
Chlamydia: NEGATIVE
Neisseria Gonorrhea: NEGATIVE

## 2017-11-22 ENCOUNTER — Inpatient Hospital Stay (HOSPITAL_COMMUNITY)
Admission: AD | Admit: 2017-11-22 | Discharge: 2017-11-22 | Disposition: A | Discharge status: Home / Self Care | Payer: Managed Care, Other (non HMO) | Source: Ambulatory Visit | Attending: Obstetrics & Gynecology | Admitting: Obstetrics & Gynecology

## 2017-11-22 ENCOUNTER — Inpatient Hospital Stay (HOSPITAL_COMMUNITY): Payer: Managed Care, Other (non HMO)

## 2017-11-22 ENCOUNTER — Encounter (HOSPITAL_COMMUNITY): Payer: Self-pay

## 2017-11-22 DIAGNOSIS — O23591 Infection of other part of genital tract in pregnancy, first trimester: Secondary | ICD-10-CM | POA: Diagnosis not present

## 2017-11-22 DIAGNOSIS — Z3A01 Less than 8 weeks gestation of pregnancy: Secondary | ICD-10-CM | POA: Insufficient documentation

## 2017-11-22 DIAGNOSIS — B9689 Other specified bacterial agents as the cause of diseases classified elsewhere: Secondary | ICD-10-CM | POA: Diagnosis not present

## 2017-11-22 DIAGNOSIS — O99331 Smoking (tobacco) complicating pregnancy, first trimester: Secondary | ICD-10-CM | POA: Insufficient documentation

## 2017-11-22 DIAGNOSIS — N760 Acute vaginitis: Secondary | ICD-10-CM

## 2017-11-22 DIAGNOSIS — Z3491 Encounter for supervision of normal pregnancy, unspecified, first trimester: Secondary | ICD-10-CM

## 2017-11-22 DIAGNOSIS — R109 Unspecified abdominal pain: Secondary | ICD-10-CM

## 2017-11-22 DIAGNOSIS — O26891 Other specified pregnancy related conditions, first trimester: Secondary | ICD-10-CM | POA: Diagnosis not present

## 2017-11-22 HISTORY — DX: Other specified health status: Z78.9

## 2017-11-22 LAB — URINALYSIS, ROUTINE W REFLEX MICROSCOPIC
BACTERIA UA: NONE SEEN
Bilirubin Urine: NEGATIVE
Glucose, UA: NEGATIVE mg/dL
Hgb urine dipstick: NEGATIVE
Ketones, ur: NEGATIVE mg/dL
NITRITE: NEGATIVE
Protein, ur: 30 mg/dL — AB
SPECIFIC GRAVITY, URINE: 1.028 (ref 1.005–1.030)
pH: 5 (ref 5.0–8.0)

## 2017-11-22 LAB — CBC
HCT: 38 % (ref 36.0–46.0)
Hemoglobin: 13 g/dL (ref 12.0–15.0)
MCH: 30.9 pg (ref 26.0–34.0)
MCHC: 34.2 g/dL (ref 30.0–36.0)
MCV: 90.3 fL (ref 78.0–100.0)
PLATELETS: 273 10*3/uL (ref 150–400)
RBC: 4.21 MIL/uL (ref 3.87–5.11)
RDW: 11.8 % (ref 11.5–15.5)
WBC: 7.2 10*3/uL (ref 4.0–10.5)

## 2017-11-22 LAB — GC/CHLAMYDIA PROBE AMP (~~LOC~~) NOT AT ARMC
Chlamydia: NEGATIVE
NEISSERIA GONORRHEA: NEGATIVE

## 2017-11-22 LAB — WET PREP, GENITAL
Sperm: NONE SEEN
Trich, Wet Prep: NONE SEEN
YEAST WET PREP: NONE SEEN

## 2017-11-22 LAB — HCG, QUANTITATIVE, PREGNANCY: HCG, BETA CHAIN, QUANT, S: 21494 m[IU]/mL — AB (ref <5)

## 2017-11-22 LAB — POCT PREGNANCY, URINE: PREG TEST UR: POSITIVE — AB

## 2017-11-22 LAB — ABO/RH: ABO/RH(D): O POS

## 2017-11-22 MED ORDER — METRONIDAZOLE 500 MG PO TABS: 500.0000 mg | ORAL_TABLET | Freq: Two times a day (BID) | ORAL | 0 refills | Status: DC

## 2017-11-22 NOTE — MAU Note (Signed)
Pt here with positive pregnancy test tonight and period is 7 days late. Having some cramping. Denies any bleeding.

## 2017-11-22 NOTE — Discharge Instructions (Signed)
Bacterial Vaginosis Bacterial vaginosis is a vaginal infection that occurs when the normal balance of bacteria in the vagina is disrupted. It results from an overgrowth of certain bacteria. This is the most common vaginal infection among women ages 15-44. Because bacterial vaginosis increases your risk for STIs (sexually transmitted infections), getting treated can help reduce your risk for chlamydia, gonorrhea, herpes, and HIV (human immunodeficiency virus). Treatment is also important for preventing complications in pregnant women, because this condition can cause an early (premature) delivery. What are the causes? This condition is caused by an increase in harmful bacteria that are normally present in small amounts in the vagina. However, the reason that the condition develops is not fully understood. What increases the risk? The following factors may make you more likely to develop this condition:  Having a new sexual partner or multiple sexual partners.  Having unprotected sex.  Douching.  Having an intrauterine device (IUD).  Smoking.  Drug and alcohol abuse.  Taking certain antibiotic medicines.  Being pregnant.  You cannot get bacterial vaginosis from toilet seats, bedding, swimming pools, or contact with objects around you. What are the signs or symptoms? Symptoms of this condition include:  Grey or white vaginal discharge. The discharge can also be watery or foamy.  A fish-like odor with discharge, especially after sexual intercourse or during menstruation.  Itching in and around the vagina.  Burning or pain with urination.  Some women with bacterial vaginosis have no signs or symptoms. How is this diagnosed? This condition is diagnosed based on:  Your medical history.  A physical exam of the vagina.  Testing a sample of vaginal fluid under a microscope to look for a large amount of bad bacteria or abnormal cells. Your health care provider may use a cotton swab  or a small wooden spatula to collect the sample.  How is this treated? This condition is treated with antibiotics. These may be given as a pill, a vaginal cream, or a medicine that is put into the vagina (suppository). If the condition comes back after treatment, a second round of antibiotics may be needed. Follow these instructions at home: Medicines  Take over-the-counter and prescription medicines only as told by your health care provider.  Take or use your antibiotic as told by your health care provider. Do not stop taking or using the antibiotic even if you start to feel better. General instructions  If you have a female sexual partner, tell her that you have a vaginal infection. She should see her health care provider and be treated if she has symptoms. If you have a female sexual partner, he does not need treatment.  During treatment: ? Avoid sexual activity until you finish treatment. ? Do not douche. ? Avoid alcohol as directed by your health care provider. ? Avoid breastfeeding as directed by your health care provider.  Drink enough water and fluids to keep your urine clear or pale yellow.  Keep the area around your vagina and rectum clean. ? Wash the area daily with warm water. ? Wipe yourself from front to back after using the toilet.  Keep all follow-up visits as told by your health care provider. This is important. How is this prevented?  Do not douche.  Wash the outside of your vagina with warm water only.  Use protection when having sex. This includes latex condoms and dental dams.  Limit how many sexual partners you have. To help prevent bacterial vaginosis, it is best to have sex with just   one partner (monogamous).  Make sure you and your sexual partner are tested for STIs.  Wear cotton or cotton-lined underwear.  Avoid wearing tight pants and pantyhose, especially during summer.  Limit the amount of alcohol that you drink.  Do not use any products that  contain nicotine or tobacco, such as cigarettes and e-cigarettes. If you need help quitting, ask your health care provider.  Do not use illegal drugs. Where to find more information:  Centers for Disease Control and Prevention: www.cdc.gov/std  American Sexual Health Association (ASHA): www.ashastd.org  U.S. Department of Health and Human Services, Office on Women's Health: www.womenshealth.gov/ or https://www.womenshealth.gov/a-z-topics/bacterial-vaginosis Contact a health care provider if:  Your symptoms do not improve, even after treatment.  You have more discharge or pain when urinating.  You have a fever.  You have pain in your abdomen.  You have pain during sex.  You have vaginal bleeding between periods. Summary  Bacterial vaginosis is a vaginal infection that occurs when the normal balance of bacteria in the vagina is disrupted.  Because bacterial vaginosis increases your risk for STIs (sexually transmitted infections), getting treated can help reduce your risk for chlamydia, gonorrhea, herpes, and HIV (human immunodeficiency virus). Treatment is also important for preventing complications in pregnant women, because the condition can cause an early (premature) delivery.  This condition is treated with antibiotic medicines. These may be given as a pill, a vaginal cream, or a medicine that is put into the vagina (suppository). This information is not intended to replace advice given to you by your health care provider. Make sure you discuss any questions you have with your health care provider. Document Released: 11/28/2005 Document Revised: 08/13/2016 Document Reviewed: 08/13/2016 Elsevier Interactive Patient Education  2017 [employer]  

## 2017-11-22 NOTE — MAU Provider Note (Signed)
Chief Complaint: Possible Pregnancy   First Provider Initiated Contact with Patient 11/22/17 0420        SUBJECTIVE HPI: Rebecca Bradley is a 36 y.o. G1P0 at 3337w1d by LMP who presents to maternity admissions reporting abdominal cramping & positive pregnancy test. Symptoms began yesterday. Symptoms include lower abdominal cramping, breast tenderness, & abdominal bloating. Denies n/v/d, dysuria, vaginal bleeding, or vaginal discharge. Rates pain 2/10. Has not treated pain. Nothing makes better or worse.    Past Medical History:  Diagnosis Date  . BV (bacterial vaginosis)   . Medical history non-contributory    Past Surgical History:  Procedure Laterality Date  . NO PAST SURGERIES     Social History   Socioeconomic History  . Marital status: Single    Spouse name: Not on file  . Number of children: Not on file  . Years of education: Not on file  . Highest education level: Not on file  Social Needs  . Financial resource strain: Not on file  . Food insecurity - worry: Not on file  . Food insecurity - inability: Not on file  . Transportation needs - medical: Not on file  . Transportation needs - non-medical: Not on file  Occupational History  . Not on file  Tobacco Use  . Smoking status: Current Some Day Smoker    Packs/day: 0.00  . Smokeless tobacco: Never Used  Substance and Sexual Activity  . Alcohol use: Yes    Comment: occ  . Drug use: No  . Sexual activity: Not on file  Other Topics Concern  . Not on file  Social History Narrative  . Not on file   No current facility-administered medications on file prior to encounter.    Current Outpatient Medications on File Prior to Encounter  Medication Sig Dispense Refill  . naproxen (NAPROSYN) 500 MG tablet Take 1 tablet (500 mg total) 2 (two) times daily by mouth. 30 tablet 0   No Known Allergies  I have reviewed patient's Past Medical Hx, Surgical Hx, Family Hx, Social Hx, medications and allergies.   ROS:  Review of  Systems  Constitutional: Negative.   Gastrointestinal: Positive for abdominal pain. Negative for constipation, diarrhea, nausea and vomiting.  Genitourinary: Negative.    Review of Systems  Other systems negative   Physical Exam  Physical Exam  Constitutional: She is well-developed, well-nourished, and in no distress. No distress.  HENT:  Head: Normocephalic and atraumatic.  Pulmonary/Chest: Effort normal. No respiratory distress.  Abdominal: Soft. She exhibits distension. There is no tenderness.  Genitourinary: Uterus normal, right adnexa normal and left adnexa normal. Cervix exhibits no motion tenderness.  Skin: Skin is warm and dry. She is not diaphoretic.  Psychiatric: Affect and judgment normal.  Nursing note and vitals reviewed.  Patient Vitals for the past 24 hrs:  BP Temp Temp src Pulse Resp SpO2 Height Weight  11/22/17 0339 138/89 98.4 F (36.9 C) Oral 87 18 100 % 5\' 5"  (1.651 m) 162 lb (73.5 kg)     LAB RESULTS Results for orders placed or performed during the hospital encounter of 11/22/17 (from the past 24 hour(s))  Urinalysis, Routine w reflex microscopic     Status: Abnormal   Collection Time: 11/22/17  3:43 AM  Result Value Ref Range   Color, Urine YELLOW YELLOW   APPearance HAZY (A) CLEAR   Specific Gravity, Urine 1.028 1.005 - 1.030   pH 5.0 5.0 - 8.0   Glucose, UA NEGATIVE NEGATIVE mg/dL   Hgb  urine dipstick NEGATIVE NEGATIVE   Bilirubin Urine NEGATIVE NEGATIVE   Ketones, ur NEGATIVE NEGATIVE mg/dL   Protein, ur 30 (A) NEGATIVE mg/dL   Nitrite NEGATIVE NEGATIVE   Leukocytes, UA TRACE (A) NEGATIVE   RBC / HPF 0-5 0 - 5 RBC/hpf   WBC, UA 6-30 0 - 5 WBC/hpf   Bacteria, UA NONE SEEN NONE SEEN   Squamous Epithelial / LPF 6-30 (A) NONE SEEN   Mucus PRESENT   Pregnancy, urine POC     Status: Abnormal   Collection Time: 11/22/17  4:18 AM  Result Value Ref Range   Preg Test, Ur POSITIVE (A) NEGATIVE  CBC     Status: None   Collection Time: 11/22/17  4:43  AM  Result Value Ref Range   WBC 7.2 4.0 - 10.5 K/uL   RBC 4.21 3.87 - 5.11 MIL/uL   Hemoglobin 13.0 12.0 - 15.0 g/dL   HCT 16.138.0 09.636.0 - 04.546.0 %   MCV 90.3 78.0 - 100.0 fL   MCH 30.9 26.0 - 34.0 pg   MCHC 34.2 30.0 - 36.0 g/dL   RDW 40.911.8 81.111.5 - 91.415.5 %   Platelets 273 150 - 400 K/uL  hCG, quantitative, pregnancy     Status: Abnormal   Collection Time: 11/22/17  4:43 AM  Result Value Ref Range   hCG, Beta Chain, Quant, S 21,494 (H) <5 mIU/mL  Wet prep, genital     Status: Abnormal   Collection Time: 11/22/17  5:03 AM  Result Value Ref Range   Yeast Wet Prep HPF POC NONE SEEN NONE SEEN   Trich, Wet Prep NONE SEEN NONE SEEN   Clue Cells Wet Prep HPF POC PRESENT (A) NONE SEEN   WBC, Wet Prep HPF POC FEW (A) NONE SEEN   Sperm NONE SEEN        IMAGING [place] Ob Comp Less 14 Wks  Result Date: 11/22/2017 CLINICAL DATA:  Acute onset of pelvic cramping. EXAM: OBSTETRIC <14 WK US AND TRANSVAGINAL OB US TECHNIQUE: Both transabdominal and transvaginal ultrasound examinations were performed for complete evaluation of the gestation as well as the maternal uterus, adnexal regions, and pelvic cul-de-sac. Transvaginal technique was performed to assess early pregnancy. COMPARISON:  Pelvic ultrasound performed 07/15/2012 FINDINGS: Intrauterine gestational sac: Single; visualized and normal in shape. Yolk sac:  Yes Embryo:  Yes Cardiac Activity: Yes Heart Rate: 116  bpm CRL:  2.5  mm   5 w   5 d                  US EDC: 07/20/2018 Subchorionic hemorrhage: A small amount of subchorionic hemorrhage is noted. Maternal uterus/adnexae: The uterus is otherwise unremarkable. The ovaries are within normal limits. The right ovary measures 2.4 x 2.1 x 1.8 cm, while the left ovary measures 4.2 x 3.4 x 2.8 cm. No suspicious adnexal masses are seen; there is no evidence for ovarian torsion. No free fluid is seen within the pelvic cul-de-sac. IMPRESSION: 1. Single live intrauterine pregnancy noted, with a crown-rump length of  2.5 mm, corresponding to a gestational age of [redacted] weeks 5 days. This matches the gestational age of [redacted] weeks 1 day by LMP, reflecting an estimated date of delivery of July 24, 2018. 2. Small amount of subchorionic hemorrhage noted. Electronically Signed   By: Roanna RaiderJeffery  Chang M.D.   On: 11/22/2017 05:43   [place] Ob Transvaginal  Result Date: 11/22/2017 CLINICAL DATA:  Acute onset of pelvic cramping. EXAM: OBSTETRIC <14 WK US AND TRANSVAGINAL  OB US TECHNIQUE: Both transabdominal and transvaginal ultrasound examinations were performed for complete evaluation of the gestation as well as the maternal uterus, adnexal regions, and pelvic cul-de-sac. Transvaginal technique was performed to assess early pregnancy. COMPARISON:  Pelvic ultrasound performed 07/15/2012 FINDINGS: Intrauterine gestational sac: Single; visualized and normal in shape. Yolk sac:  Yes Embryo:  Yes Cardiac Activity: Yes Heart Rate: 116  bpm CRL:  2.5  mm   5 w   5 d                  [place] EDC: 07/20/2018 Subchorionic hemorrhage: A small amount of subchorionic hemorrhage is noted. Maternal uterus/adnexae: The uterus is otherwise unremarkable. The ovaries are within normal limits. The right ovary measures 2.4 x 2.1 x 1.8 cm, while the left ovary measures 4.2 x 3.4 x 2.8 cm. No suspicious adnexal masses are seen; there is no evidence for ovarian torsion. No free fluid is seen within the pelvic cul-de-sac. IMPRESSION: 1. Single live intrauterine pregnancy noted, with a crown-rump length of 2.5 mm, corresponding to a gestational age of [redacted] weeks 5 days. This matches the gestational age of [redacted] weeks 1 day by LMP, reflecting an estimated date of delivery of July 24, 2018. 2. Small amount of subchorionic hemorrhage noted. Electronically Signed   By: Roanna Raider M.D.   On: 11/22/2017 05:43    MAU Management/MDM: Ordered usual first trimester r/o ectopic labs.   Pelvic exam and cultures done Will check baseline Ultrasound to rule out ectopic.  Cultures were  done to rule out pelvic infection Blood drawn for Quant HCG, CBC, ABO/Rh  Ultrasound shows SIUP with cardiac activity    ASSESSMENT 1. Normal IUP (intrauterine pregnancy) on prenatal ultrasound, first trimester   2. Abdominal pain during pregnancy in first trimester   3. BV (bacterial vaginosis)     PLAN Discharge home Rx flagyl GC/CT pending Pt stable at time of discharge. Encouraged to return here or to other Urgent Care/ED if she develops worsening of symptoms, increase in pain, fever, or other concerning symptoms.    Judeth Horn, FNP 11/22/2017  6:00 AM

## 2017-11-25 ENCOUNTER — Emergency Department (HOSPITAL_BASED_OUTPATIENT_CLINIC_OR_DEPARTMENT_OTHER)
Admission: EM | Admit: 2017-11-25 | Discharge: 2017-11-25 | Disposition: A | Discharge status: Home / Self Care | Payer: Managed Care, Other (non HMO) | Attending: Emergency Medicine | Admitting: Emergency Medicine

## 2017-11-25 ENCOUNTER — Other Ambulatory Visit: Payer: Self-pay

## 2017-11-25 ENCOUNTER — Encounter (HOSPITAL_BASED_OUTPATIENT_CLINIC_OR_DEPARTMENT_OTHER): Payer: Self-pay | Admitting: Emergency Medicine

## 2017-11-25 DIAGNOSIS — F17200 Nicotine dependence, unspecified, uncomplicated: Secondary | ICD-10-CM | POA: Insufficient documentation

## 2017-11-25 DIAGNOSIS — Z3A01 Less than 8 weeks gestation of pregnancy: Secondary | ICD-10-CM | POA: Diagnosis not present

## 2017-11-25 DIAGNOSIS — Z3490 Encounter for supervision of normal pregnancy, unspecified, unspecified trimester: Secondary | ICD-10-CM | POA: Diagnosis not present

## 2017-11-25 DIAGNOSIS — R42 Dizziness and giddiness: Secondary | ICD-10-CM | POA: Diagnosis not present

## 2017-11-25 DIAGNOSIS — O26891 Other specified pregnancy related conditions, first trimester: Secondary | ICD-10-CM | POA: Diagnosis present

## 2017-11-25 LAB — CBC WITH DIFFERENTIAL/PLATELET
Basophils Absolute: 0 10*3/uL (ref 0.0–0.1)
Basophils Relative: 0 %
EOS ABS: 0.1 10*3/uL (ref 0.0–0.7)
Eosinophils Relative: 2 %
HCT: 35.2 % — ABNORMAL LOW (ref 36.0–46.0)
HEMOGLOBIN: 12.3 g/dL (ref 12.0–15.0)
LYMPHS ABS: 2.1 10*3/uL (ref 0.7–4.0)
LYMPHS PCT: 32 %
MCH: 30.8 pg (ref 26.0–34.0)
MCHC: 34.9 g/dL (ref 30.0–36.0)
MCV: 88.2 fL (ref 78.0–100.0)
Monocytes Absolute: 0.4 10*3/uL (ref 0.1–1.0)
Monocytes Relative: 6 %
NEUTROS ABS: 3.9 10*3/uL (ref 1.7–7.7)
NEUTROS PCT: 60 %
Platelets: 243 10*3/uL (ref 150–400)
RBC: 3.99 MIL/uL (ref 3.87–5.11)
RDW: 11.5 % (ref 11.5–15.5)
WBC: 6.5 10*3/uL (ref 4.0–10.5)

## 2017-11-25 LAB — COMPREHENSIVE METABOLIC PANEL WITH GFR
ALT: 11 U/L — ABNORMAL LOW (ref 14–54)
AST: 16 U/L (ref 15–41)
Albumin: 4.4 g/dL (ref 3.5–5.0)
Alkaline Phosphatase: 45 U/L (ref 38–126)
Anion gap: 7 (ref 5–15)
BUN: 9 mg/dL (ref 6–20)
CO2: 23 mmol/L (ref 22–32)
Calcium: 9.6 mg/dL (ref 8.9–10.3)
Chloride: 104 mmol/L (ref 101–111)
Creatinine, Ser: 0.64 mg/dL (ref 0.44–1.00)
GFR calc Af Amer: >60 mL/min (ref >60)
GFR calc non Af Amer: >60 mL/min (ref >60)
Glucose, Bld: 86 mg/dL (ref 65–99)
Potassium: 3.6 mmol/L (ref 3.5–5.1)
Sodium: 134 mmol/L — ABNORMAL LOW (ref 135–145)
Total Bilirubin: 1 mg/dL (ref 0.3–1.2)
Total Protein: 7.2 g/dL (ref 6.5–8.1)

## 2017-11-25 LAB — URINALYSIS, ROUTINE W REFLEX MICROSCOPIC
Bilirubin Urine: NEGATIVE
Glucose, UA: NEGATIVE mg/dL
Hgb urine dipstick: NEGATIVE
Ketones, ur: 15 mg/dL — AB
Leukocytes, UA: NEGATIVE
Nitrite: NEGATIVE
Protein, ur: NEGATIVE mg/dL
Specific Gravity, Urine: 1.02 (ref 1.005–1.030)
pH: 6 (ref 5.0–8.0)

## 2017-11-25 LAB — PREGNANCY, URINE: Preg Test, Ur: POSITIVE — AB

## 2017-11-25 LAB — HCG, QUANTITATIVE, PREGNANCY: hCG, Beta Chain, Quant, S: 45101 m[IU]/mL — ABNORMAL HIGH (ref <5)

## 2017-11-25 MED ORDER — METOCLOPRAMIDE HCL 10 MG PO TABS: 10.0000 mg | ORAL_TABLET | Freq: Four times a day (QID) | ORAL | 0 refills | Status: DC | PRN

## 2017-11-25 MED ORDER — PRENATAL COMPLETE 14-0.4 MG PO TABS: 1.0000 | ORAL_TABLET | Freq: Every day | ORAL | 0 refills | Status: DC

## 2017-11-25 NOTE — ED Notes (Signed)
PT discharged to home NAD.  

## 2017-11-25 NOTE — ED Notes (Signed)
ED Provider at bedside. 

## 2017-11-25 NOTE — ED Provider Notes (Addendum)
[facility] Provider Note: Lowella DellJ. Lane Shevawn Langenberg, MD, FACEP  CSN: [id number] MRN: [id number] ARRIVAL: 11/25/17 at 0054 ROOM: [facility]11/[facility]11   CHIEF COMPLAINT  Lightheaded   HISTORY OF PRESENT ILLNESS  11/25/17 3:26 AM Rebecca Bradley is a 36 y.o. female who is about [redacted] weeks pregnant.  She is here with anxiety, decreased appetite, nausea, headaches and lightheadedness since finding out she was pregnant several days ago.  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  She also is concerned that the anxiety is triggering her other symptoms.  She has had a miscarriage in the past and she is concerned that she is not getting enough nutrition to keep this pregnancy safe.  She is not having vaginal bleeding or discharge.  She is not presently having any abdominal pain.  She has a first prenatal visit scheduled for a month from now.   She has been drinking increased fluids to stay hydrated but states she is unable to eat a full meal without early satiety.   Past Medical History:  Diagnosis Date  . BV (bacterial vaginosis)   . Medical history non-contributory     Past Surgical History:  Procedure Laterality Date  . NO PAST SURGERIES      No family history on file.  Social History   Tobacco Use  . Smoking status: Current Some Day Smoker    Packs/day: 0.00  . Smokeless tobacco: Never Used  Substance Use Topics  . Alcohol use: Yes    Comment: occ  . Drug use: No    Prior to Admission medications   Medication Sig Start Date End Date Taking? Authorizing Provider  metroNIDAZOLE (FLAGYL) 500 MG tablet Take 1 tablet (500 mg total) by mouth 2 (two) times daily. 11/22/17   Judeth HornLawrence, Erin, NP    Allergies Patient has no known allergies.   REVIEW OF SYSTEMS  Negative except as noted here or in the History of Present Illness.   PHYSICAL EXAMINATION  Initial Vital Signs Blood pressure 117/78, pulse 72, temperature 98.7 F (37.1 C), temperature  source Oral, resp. rate 18, last menstrual period 10/17/2017, SpO2 100 %.  Examination General: Well-developed, well-nourished female in no acute distress; appearance consistent with age of record HENT: normocephalic; atraumatic Eyes: pupils equal, round and reactive to light; extraocular muscles intact Neck: supple Heart: regular rate and rhythm Lungs: clear to auscultation bilaterally Abdomen: soft; nondistended; nontender; bowel sounds present Extremities: No deformity; full range of motion; pulses normal Neurologic: Awake, alert and oriented; motor function intact in all extremities and symmetric; no facial droop Skin: Warm and dry Psychiatric: Normal mood and affect   RESULTS  Summary of this visit's results, reviewed by myself:   EKG Interpretation  Date/Time:    Ventricular Rate:    PR Interval:    QRS Duration:   QT Interval:    QTC Calculation:   R Axis:     Text Interpretation:        Laboratory Studies: Results for orders placed or performed during the hospital encounter of 11/25/17 (from the past 24 hour(s))  Urinalysis, Routine w reflex microscopic     Status: Abnormal   Collection Time: 11/25/17  1:15 AM  Result Value Ref Range   Color, Urine YELLOW YELLOW   APPearance CLEAR CLEAR   Specific Gravity, Urine 1.020 1.005 - 1.030   pH 6.0 5.0 - 8.0   Glucose, UA NEGATIVE NEGATIVE mg/dL   Hgb urine dipstick NEGATIVE NEGATIVE  Bilirubin Urine NEGATIVE NEGATIVE   Ketones, ur 15 (A) NEGATIVE mg/dL   Protein, ur NEGATIVE NEGATIVE mg/dL   Nitrite NEGATIVE NEGATIVE   Leukocytes, UA NEGATIVE NEGATIVE  Pregnancy, urine     Status: Abnormal   Collection Time: 11/25/17  1:15 AM  Result Value Ref Range   Preg Test, Ur POSITIVE (A) NEGATIVE  CBC with Differential     Status: Abnormal   Collection Time: 11/25/17  2:40 AM  Result Value Ref Range   WBC 6.5 4.0 - 10.5 K/uL   RBC 3.99 3.87 - 5.11 MIL/uL   Hemoglobin 12.3 12.0 - 15.0 g/dL   HCT 59.535.2 (L) 63.836.0 - 75.646.0 %    MCV 88.2 78.0 - 100.0 fL   MCH 30.8 26.0 - 34.0 pg   MCHC 34.9 30.0 - 36.0 g/dL   RDW 43.311.5 29.511.5 - 18.815.5 %   Platelets 243 150 - 400 K/uL   Neutrophils Relative % 60 %   Neutro Abs 3.9 1.7 - 7.7 K/uL   Lymphocytes Relative 32 %   Lymphs Abs 2.1 0.7 - 4.0 K/uL   Monocytes Relative 6 %   Monocytes Absolute 0.4 0.1 - 1.0 K/uL   Eosinophils Relative 2 %   Eosinophils Absolute 0.1 0.0 - 0.7 K/uL   Basophils Relative 0 %   Basophils Absolute 0.0 0.0 - 0.1 K/uL  Comprehensive metabolic panel     Status: Abnormal   Collection Time: 11/25/17  2:40 AM  Result Value Ref Range   Sodium 134 (L) 135 - 145 mmol/L   Potassium 3.6 3.5 - 5.1 mmol/L   Chloride 104 101 - 111 mmol/L   CO2 23 22 - 32 mmol/L   Glucose, Bld 86 65 - 99 mg/dL   BUN 9 6 - 20 mg/dL   Creatinine, Ser 4.160.64 0.44 - 1.00 mg/dL   Calcium 9.6 8.9 - 60.610.3 mg/dL   Total Protein 7.2 6.5 - 8.1 g/dL   Albumin 4.4 3.5 - 5.0 g/dL   AST 16 15 - 41 U/L   ALT 11 (L) 14 - 54 U/L   Alkaline Phosphatase 45 38 - 126 U/L   Total Bilirubin 1.0 0.3 - 1.2 mg/dL   GFR calc non Af Amer >60 >60 mL/min   GFR calc Af Amer >60 >60 mL/min   Anion gap 7 5 - 15   Imaging Studies: No results found.  ED COURSE  Nursing notes and initial vitals signs, including pulse oximetry, reviewed.  Vitals:   11/25/17 0344  BP: 117/78  Pulse: 72  Resp: 18  Temp: 98.7 F (37.1 C)  TempSrc: Oral  SpO2: 100%   Patient reassured that many of her symptoms are common in pregnancy and should pass.  She was advised to eat more frequent smaller meals if possible.  We will give her a short course of Reglan to help with nausea pending follow-up.  PROCEDURES    ED DIAGNOSES     ICD-10-CM   1. Pregnancy at early stage Z34.90        Kylor Valverde, Jonny RuizJohn, MD 11/25/17 0345    Paula LibraMolpus, Vennie Waymire, MD 11/25/17 [phone]

## 2017-11-25 NOTE — ED Triage Notes (Signed)
PT presents with c/o "feeling like I am in a cloud" , nausea, headaches, loss of appetite, lightheaded at times. PT found out a few days ago that she is pregnant. Pt states she quit smoking when she found out she was pregnant.

## 2017-12-05 ENCOUNTER — Encounter (HOSPITAL_COMMUNITY): Payer: Self-pay | Admitting: Emergency Medicine

## 2017-12-05 ENCOUNTER — Inpatient Hospital Stay (HOSPITAL_COMMUNITY)
Admission: AD | Admit: 2017-12-05 | Discharge: 2017-12-06 | Disposition: A | Discharge status: Home / Self Care | Payer: Managed Care, Other (non HMO) | Source: Ambulatory Visit | Attending: Family Medicine | Admitting: Family Medicine

## 2017-12-05 DIAGNOSIS — O219 Vomiting of pregnancy, unspecified: Secondary | ICD-10-CM | POA: Diagnosis not present

## 2017-12-05 DIAGNOSIS — R109 Unspecified abdominal pain: Secondary | ICD-10-CM

## 2017-12-05 DIAGNOSIS — Z3A01 Less than 8 weeks gestation of pregnancy: Secondary | ICD-10-CM | POA: Insufficient documentation

## 2017-12-05 DIAGNOSIS — O26891 Other specified pregnancy related conditions, first trimester: Secondary | ICD-10-CM

## 2017-12-05 DIAGNOSIS — O99611 Diseases of the digestive system complicating pregnancy, first trimester: Secondary | ICD-10-CM

## 2017-12-05 DIAGNOSIS — K5900 Constipation, unspecified: Secondary | ICD-10-CM | POA: Insufficient documentation

## 2017-12-05 DIAGNOSIS — R112 Nausea with vomiting, unspecified: Secondary | ICD-10-CM | POA: Diagnosis not present

## 2017-12-05 LAB — URINALYSIS, ROUTINE W REFLEX MICROSCOPIC
BILIRUBIN URINE: NEGATIVE
Glucose, UA: NEGATIVE mg/dL
HGB URINE DIPSTICK: NEGATIVE
KETONES UR: NEGATIVE mg/dL
NITRITE: NEGATIVE
PROTEIN: NEGATIVE mg/dL
SPECIFIC GRAVITY, URINE: 1.014 (ref 1.005–1.030)
pH: 6 (ref 5.0–8.0)

## 2017-12-05 MED ORDER — METOCLOPRAMIDE HCL 10 MG PO TABS: 10.0000 mg | ORAL_TABLET | Freq: Four times a day (QID) | ORAL | 1 refills | Status: DC | PRN

## 2017-12-05 MED ORDER — METRONIDAZOLE 0.75 % VA GEL: 1.0000 | Freq: Every day | VAGINAL | 1 refills | Status: DC

## 2017-12-05 MED ORDER — POLYETHYLENE GLYCOL 3350 17 GM/SCOOP PO POWD: 17.0000 g | Freq: Every day | ORAL | 2 refills | Status: DC | PRN

## 2017-12-05 NOTE — Discharge Instructions (Signed)
[place] for [employer] at [facility]       Phone: [phone]  [facility] for [employer] at [employer] Phone: [place] for [employer] at [place]  Phone: [place] for [employer] at [employer]  Phone: [place] for [place] at [place]  Phone: [facility]       Phone: [phone]  [facility]    Phone: [phone]   [facility])    Phone: [facility]    Phone: [phone]  [facility]    Phone: [phone]  [place]    Phone: [phone]  [place] [facility]       Phone: [phone]   [place] [facility]  Phone: [phone]  [place]    Phone: [phone]  [facility]   Phone: [phone]  [facility]      Phone: [phone]  [facility]    Phone: [phone]    Morning Sickness Morning sickness is when you feel sick to your stomach (nauseous) during pregnancy. This nauseous feeling may or may not come with vomiting. It often occurs in the morning but can be a problem any time of day. Morning sickness is most common during the first trimester, but it may continue throughout pregnancy. While morning sickness is unpleasant, it is usually harmless unless you develop severe and continual vomiting (hyperemesis gravidarum). This condition requires more intense treatment. What are the causes? The cause of morning sickness is not completely known but seems to be related to normal hormonal changes that occur in pregnancy. What increases the risk? You are at greater risk if you:  Experienced nausea or vomiting before your pregnancy.  Had morning sickness during a  previous pregnancy.  Are pregnant with more than one baby, such as twins.  How is this treated? Do not use any medicines (prescription, over-the-counter, or herbal) for morning sickness without first talking to your health care provider. Your health care provider may prescribe or recommend:  Vitamin B6 supplements.  Anti-nausea medicines.  The herbal medicine ginger.  Follow these instructions at home:  Only take over-the-counter or prescription medicines as directed by your health care provider.  Taking multivitamins before getting pregnant can prevent or decrease the severity of morning sickness in most women.  Eat a piece of dry toast or unsalted crackers before getting out of bed in the morning.  Eat five or six small meals a day.  Eat dry and bland foods (rice, baked potato). Foods high in carbohydrates are often helpful.  Do not drink liquids with your meals. Drink liquids between meals.  Avoid greasy, fatty, and spicy foods.  Get someone to cook for you if the smell of any food causes nausea and vomiting.  If you feel nauseous after taking prenatal vitamins, take the vitamins at night or with a snack.  Snack on protein foods (nuts, yogurt, cheese) between meals if you are hungry.  Eat unsweetened gelatins for desserts.  Wearing an acupressure wristband (worn for sea sickness) may be helpful.  Acupuncture may be helpful.  Do not smoke.  Get a humidifier to keep the air in your house free of odors.  Get plenty of fresh air. Contact a health care provider if:  Your home remedies are not working, and you need medicine.  You feel dizzy or  lightheaded.  You are losing weight. Get help right away if:  You have persistent and uncontrolled nausea and vomiting.  You pass out (faint). This information is not intended to replace advice given to you by your health care provider. Make sure you discuss any questions you have with your health care provider. Document  Released: 01/19/2007 Document Revised: 05/05/2016 Document Reviewed: 05/15/2013 Elsevier Interactive Patient Education  2017 [employer]   Constipation, Adult Constipation is when a person has fewer bowel movements in a week than normal, has difficulty having a bowel movement, or has stools that are dry, hard, or larger than normal. Constipation may be caused by an underlying condition. It may become worse with age if a person takes certain medicines and does not take in enough fluids. Follow these instructions at home: Eating and drinking   Eat foods that have a lot of fiber, such as fresh fruits and vegetables, whole grains, and beans.  Limit foods that are high in fat, low in fiber, or overly processed, such as french fries, hamburgers, cookies, candies, and soda.  Drink enough fluid to keep your urine clear or pale yellow. General instructions  Exercise regularly or as told by your health care provider.  Go to the restroom when you have the urge to go. Do not hold it in.  Take over-the-counter and prescription medicines only as told by your health care provider. These include any fiber supplements.  Practice pelvic floor retraining exercises, such as deep breathing while relaxing the lower abdomen and pelvic floor relaxation during bowel movements.  Watch your condition for any changes.  Keep all follow-up visits as told by your health care provider. This is important. Contact a health care provider if:  You have pain that gets worse.  You have a fever.  You do not have a bowel movement after 4 days.  You vomit.  You are not hungry.  You lose weight.  You are bleeding from the anus.  You have thin, pencil-like stools. Get help right away if:  You have a fever and your symptoms suddenly get worse.  You leak stool or have blood in your stool.  Your abdomen is bloated.  You have severe pain in your abdomen.  You feel dizzy or you faint. This information is  not intended to replace advice given to you by your health care provider. Make sure you discuss any questions you have with your health care provider. Document Released: 08/26/2004 Document Revised: 06/17/2016 Document Reviewed: 05/18/2016 Elsevier Interactive Patient Education  2018 [employer]

## 2017-12-05 NOTE — MAU Provider Note (Signed)
History    First Provider Initiated Contact with Patient 12/05/17 2349      Chief Complaint:  Abdominal Pain and Emesis   Rebecca Bradley is  36 y.o. G3P1011 Patient'Bradley last menstrual period was 10/17/2017 (exact date).. Patient is here for ongoing low abd pain, N/V and worried that she has not been able to keep down Flagyl that was Rx'd for BV.  She is 5657w0d weeks gestation  by LMP, early ultrasound. Has been seen in the ED for these complaints. US showed live IUP. Rx Reglan helping w/ N/V, but only Rx'd 12 tablets. Pt is very nervous because she had a miscarriage a few years ago and is worried about it happening again.  Since her last visit, the patient is without new complaint.     ROS Abdominal Pain: Intermittent, generalized abd pain, lower>upper. Feels "like I'm bloated". Hasn't tried anything for the pain.  Vaginal bleeding: none now.   Passage of clots or tissue: Denies Dizziness: Denies Associated SX: Neg for fever, chills, VB, vaginal discharge. Pos for N/V/C. Used to have BM'Bradley every day, now Q3 days.   O POS   Physical Exam   Patient Vitals for the past 24 hrs:  BP Temp Temp src Pulse Resp SpO2 Height Weight  12/05/17 2216 125/85 98.5 F (36.9 C) Oral 82 18 100 % 5\' 5"  (1.651 m) 159 lb (72.1 kg)   Constitutional: Well-nourished female in no apparent distress. No pallor Neuro: Alert and oriented 4 Cardiovascular: Normal rate Respiratory: Normal effort and rate Abdomen: Soft, non-tender, mildly distended.  Gynecological Exam: examination not indicated   Labs: Results for orders placed or performed during the hospital encounter of 12/05/17 (from the past 24 hour(Bradley))  Urinalysis, Routine w reflex microscopic   Collection Time: 12/05/17 10:18 PM  Result Value Ref Range   Color, Urine YELLOW YELLOW   APPearance CLEAR CLEAR   Specific Gravity, Urine 1.014 1.005 - 1.030   pH 6.0 5.0 - 8.0   Glucose, UA NEGATIVE NEGATIVE mg/dL   Hgb urine dipstick NEGATIVE NEGATIVE   Bilirubin Urine NEGATIVE NEGATIVE   Ketones, ur NEGATIVE NEGATIVE mg/dL   Protein, ur NEGATIVE NEGATIVE mg/dL   Nitrite NEGATIVE NEGATIVE   Leukocytes, UA TRACE (A) NEGATIVE   RBC / HPF 0-5 0 - 5 RBC/hpf   WBC, UA 0-5 0 - 5 WBC/hpf   Bacteria, UA RARE (A) NONE SEEN   Squamous Epithelial / LPF 0-5 (A) NONE SEEN   Mucus PRESENT     Ultrasound Studies:   FHR 152 by informal BS US.   MAU course/MDM: Orders Placed This Encounter  Procedures  . Urinalysis, Routine w reflex microscopic  . Discharge patient   Meds ordered this encounter  Medications  . metoCLOPramide (REGLAN) 10 MG tablet    Sig: Take 1 tablet (10 mg total) by mouth every 6 (six) hours as needed for nausea (nausea/headache).    Dispense:  30 tablet    Refill:  1    Order Specific Question:   Supervising Provider    Answer:   Rebecca Bradley [[id number]]  . metroNIDAZOLE (METROGEL) 0.75 % vaginal gel    Sig: Place 1 Applicatorful vaginally at bedtime. Apply one applicatorful to vagina at bedtime for 5 days    Dispense:  70 g    Refill:  1    Order Specific Question:   Supervising Provider    Answer:   Rebecca Bradley [[id number]]  . polyethylene glycol powder (GLYCOLAX/MIRALAX) powder  Sig: Take 17 g by mouth daily as needed for moderate constipation.    Dispense:  500 g    Refill:  2    Order Specific Question:   Supervising Provider    Answer:   Rebecca Bradley [[id number]]    - Pain in early pregnancy with normal FHR, UA and hemodynamically stable. Likely 2/2 constipation and bloating. Rx Miralax. Untreated BV may be contributing/ Will try Metrogel. Informed that it it may not be as effective at PO Flagyl. No evidence of emergent condition. Pt very relieved by confirmation of FHR. - N/V of pregnancy adequately controlled w/ Reglan. Would like refill. Declines meds while in MAU due to drowsiness,    Assessment: 1. Abdominal pain during pregnancy in first trimester   2. Nausea/vomiting in pregnancy   3. Constipation during  pregnancy in first trimester    Plan: Discharge home in stable condition. First trimester precautions Follow-up Information    Obstetrician of your choice Follow up.   Why:  start prenatal care       [facility] [place] [facility] Follow up.   Why:  in pregnancy emergencies Contact information: [address] [id number] m[place] [phone]         Allergies as of 12/05/2017   No Known Allergies     Medication List    STOP taking these medications   metroNIDAZOLE 500 MG tablet Commonly known as:  FLAGYL     TAKE these medications   metoCLOPramide 10 MG tablet Commonly known as:  REGLAN Take 1 tablet (10 mg total) by mouth every 6 (six) hours as needed for nausea (nausea/headache).   metroNIDAZOLE 0.75 % vaginal gel Commonly known as:  METROGEL Place 1 Applicatorful vaginally at bedtime. Apply one applicatorful to vagina at bedtime for 5 days   polyethylene glycol powder powder Commonly known as:  GLYCOLAX/MIRALAX Take 17 g by mouth daily as needed for moderate constipation.   PRENATAL COMPLETE 14-0.4 MG Tabs Take 1 tablet by mouth daily.       [name], [name], CNM 12/05/2017, 11:50 PM  2/3

## 2017-12-05 NOTE — MAU Note (Signed)
Pt here with c/o vomiting and nausea, abdominal pain. Denies any bleeding.

## 2017-12-06 DIAGNOSIS — O26891 Other specified pregnancy related conditions, first trimester: Secondary | ICD-10-CM | POA: Diagnosis not present

## 2017-12-15 ENCOUNTER — Inpatient Hospital Stay (HOSPITAL_COMMUNITY)
Admission: AD | Admit: 2017-12-15 | Discharge: 2017-12-15 | Disposition: A | Discharge status: Home / Self Care | Payer: Managed Care, Other (non HMO) | Source: Ambulatory Visit | Attending: Obstetrics & Gynecology | Admitting: Obstetrics & Gynecology

## 2017-12-15 ENCOUNTER — Encounter (HOSPITAL_COMMUNITY): Payer: Self-pay | Admitting: *Deleted

## 2017-12-15 ENCOUNTER — Other Ambulatory Visit: Payer: Self-pay

## 2017-12-15 DIAGNOSIS — Z3A08 8 weeks gestation of pregnancy: Secondary | ICD-10-CM | POA: Insufficient documentation

## 2017-12-15 DIAGNOSIS — R109 Unspecified abdominal pain: Secondary | ICD-10-CM | POA: Insufficient documentation

## 2017-12-15 DIAGNOSIS — F17210 Nicotine dependence, cigarettes, uncomplicated: Secondary | ICD-10-CM | POA: Insufficient documentation

## 2017-12-15 DIAGNOSIS — O26899 Other specified pregnancy related conditions, unspecified trimester: Secondary | ICD-10-CM

## 2017-12-15 DIAGNOSIS — O26891 Other specified pregnancy related conditions, first trimester: Secondary | ICD-10-CM

## 2017-12-15 DIAGNOSIS — N898 Other specified noninflammatory disorders of vagina: Secondary | ICD-10-CM

## 2017-12-15 DIAGNOSIS — Z79899 Other long term (current) drug therapy: Secondary | ICD-10-CM | POA: Diagnosis not present

## 2017-12-15 DIAGNOSIS — O99331 Smoking (tobacco) complicating pregnancy, first trimester: Secondary | ICD-10-CM | POA: Diagnosis not present

## 2017-12-15 LAB — URINALYSIS, ROUTINE W REFLEX MICROSCOPIC
BILIRUBIN URINE: NEGATIVE
GLUCOSE, UA: 50 mg/dL — AB
Hgb urine dipstick: NEGATIVE
KETONES UR: NEGATIVE mg/dL
Nitrite: NEGATIVE
PH: 5 (ref 5.0–8.0)
Protein, ur: NEGATIVE mg/dL
Specific Gravity, Urine: 1.02 (ref 1.005–1.030)

## 2017-12-15 LAB — WET PREP, GENITAL
Clue Cells Wet Prep HPF POC: NONE SEEN
Sperm: NONE SEEN
TRICH WET PREP: NONE SEEN
YEAST WET PREP: NONE SEEN

## 2017-12-15 LAB — CBC
HCT: 32.3 % — ABNORMAL LOW (ref 36.0–46.0)
Hemoglobin: 11.4 g/dL — ABNORMAL LOW (ref 12.0–15.0)
MCH: 30.7 pg (ref 26.0–34.0)
MCHC: 35.3 g/dL (ref 30.0–36.0)
MCV: 87.1 fL (ref 78.0–100.0)
PLATELETS: 230 10*3/uL (ref 150–400)
RBC: 3.71 MIL/uL — ABNORMAL LOW (ref 3.87–5.11)
RDW: 11.9 % (ref 11.5–15.5)
WBC: 6 10*3/uL (ref 4.0–10.5)

## 2017-12-15 NOTE — MAU Note (Signed)
Pt reports lower abd cramping , some bloody discharge.

## 2017-12-15 NOTE — MAU Provider Note (Signed)
History     CSN: [id number]  Arrival date and time: 12/15/17 1656   First Provider Initiated Contact with Patient 12/15/17 1917     Chief Complaint  Patient presents with  . Vaginal Discharge  . Vaginal Bleeding  . Abdominal Pain   HPI Rebecca Bradley is a 37 y.o. G3P1011 at [redacted]w[redacted]d who presents complaining of lower abdominal cramping and vaginal bleeding. She states she noticed the cramping this morning before she went to work. She rates the pain a 6/10 and has not taken any medication for it. She states she noticed pink/brown spotting when she wiped today also. Denies recent intercourse. Was treated for BV with metrogel last week and finished it on 1/1. She has not started prenatal care anywhere.  OB History    Gravida Para Term Preterm AB Living   3 1 1   1 1    SAB TAB Ectopic Multiple Live Births   1       1      Past Medical History:  Diagnosis Date  . BV (bacterial vaginosis)   . Medical history non-contributory     Past Surgical History:  Procedure Laterality Date  . NO PAST SURGERIES      History reviewed. No pertinent family history.  Social History   Tobacco Use  . Smoking status: Current Some Day Smoker    Packs/day: 0.00  . Smokeless tobacco: Never Used  Substance Use Topics  . Alcohol use: No    Frequency: Never  . Drug use: No    Allergies: No Known Allergies  Medications Prior to Admission  Medication Sig Dispense Refill Last Dose  . metoCLOPramide (REGLAN) 10 MG tablet Take 1 tablet (10 mg total) by mouth every 6 (six) hours as needed for nausea (nausea/headache). 30 tablet 1   . metroNIDAZOLE (METROGEL) 0.75 % vaginal gel Place 1 Applicatorful vaginally at bedtime. Apply one applicatorful to vagina at bedtime for 5 days 70 g 1   . polyethylene glycol powder (GLYCOLAX/MIRALAX) powder Take 17 g by mouth daily as needed for moderate constipation. 500 g 2   . Prenatal Vit-Fe Fumarate-FA (PRENATAL COMPLETE) 14-0.4 MG TABS Take 1 tablet by mouth daily.  60 each 0     Review of Systems  Constitutional: Negative.  Negative for fatigue and fever.  HENT: Negative.   Respiratory: Negative.  Negative for shortness of breath.   Cardiovascular: Negative.  Negative for chest pain.  Gastrointestinal: Positive for abdominal pain. Negative for constipation, diarrhea, nausea and vomiting.  Genitourinary: Positive for vaginal bleeding and vaginal discharge. Negative for dysuria.  Neurological: Negative.  Negative for dizziness and headaches.   Physical Exam   Blood pressure 115/75, pulse 89, temperature 98.5 F (36.9 C), temperature source Oral, resp. rate 16, height 5\' 5"  (1.651 m), weight 161 lb (73 kg), last menstrual period 10/17/2017, SpO2 100 %.  Physical Exam  Nursing note and vitals reviewed. Constitutional: She is oriented to person, place, and time. She appears well-developed and well-nourished. No distress.  HENT:  Head: Normocephalic.  Eyes: Pupils are equal, round, and reactive to light.  Cardiovascular: Normal rate, regular rhythm and normal heart sounds.  Respiratory: Effort normal and breath sounds normal. No respiratory distress.  GI: Soft. Bowel sounds are normal. She exhibits no distension. There is no tenderness.  Genitourinary: Vaginal discharge (small amount of thick white discharge) found.  Neurological: She is alert and oriented to person, place, and time.  Skin: Skin is warm and dry.  Psychiatric: She  has a normal mood and affect. Her behavior is normal. Judgment and thought content normal.   Pelvic exam: Cervix pink, visually closed, without lesion, vaginal walls and external genitalia normal Bimanual exam: Cervix 0/long/high, firm, anterior, neg CMT, uterus nontender, adnexa without tenderness, enlargement, or mass  MAU Course  Procedures Results for orders placed or performed during the hospital encounter of 12/15/17 (from the past 24 hour(s))  Urinalysis, Routine w reflex microscopic     Status: Abnormal    Collection Time: 12/15/17  5:26 PM  Result Value Ref Range   Color, Urine YELLOW YELLOW   APPearance HAZY (A) CLEAR   Specific Gravity, Urine 1.020 1.005 - 1.030   pH 5.0 5.0 - 8.0   Glucose, UA 50 (A) NEGATIVE mg/dL   Hgb urine dipstick NEGATIVE NEGATIVE   Bilirubin Urine NEGATIVE NEGATIVE   Ketones, ur NEGATIVE NEGATIVE mg/dL   Protein, ur NEGATIVE NEGATIVE mg/dL   Nitrite NEGATIVE NEGATIVE   Leukocytes, UA TRACE (A) NEGATIVE   RBC / HPF 0-5 0 - 5 RBC/hpf   WBC, UA 0-5 0 - 5 WBC/hpf   Bacteria, UA RARE (A) NONE SEEN   Squamous Epithelial / LPF 6-30 (A) NONE SEEN   Mucus PRESENT   CBC     Status: Abnormal   Collection Time: 12/15/17  7:08 PM  Result Value Ref Range   WBC 6.0 4.0 - 10.5 K/uL   RBC 3.71 (L) 3.87 - 5.11 MIL/uL   Hemoglobin 11.4 (L) 12.0 - 15.0 g/dL   HCT 16.132.3 (L) 09.636.0 - 04.546.0 %   MCV 87.1 78.0 - 100.0 fL   MCH 30.7 26.0 - 34.0 pg   MCHC 35.3 30.0 - 36.0 g/dL   RDW 40.911.9 81.111.5 - 91.415.5 %   Platelets 230 150 - 400 K/uL   MDM UA CBC Wet prep and gc/chlamydia Bedside u/s performed by [name]- IUP with HR of 162bpm Assessment and Plan   1. Vaginal discharge during pregnancy in first trimester   2. Abdominal pain affecting pregnancy   3. [redacted] weeks gestation of pregnancy    -Discharge home in stable condition -Vaginal bleeding and pain precautions discussed -Patient advised to follow-up with OB/GYN provider of choice to start prenatal care -Patient may return to MAU as needed or if her condition were to change or worsen   Rolm BookbinderCaroline M Johnsie Moscoso CNM 12/15/2017, 7:36 PM

## 2017-12-15 NOTE — Discharge Instructions (Signed)
Abdominal Pain During Pregnancy °Abdominal pain is common in pregnancy. Most of the time, it does not cause harm. There are many causes of abdominal pain. Some causes are more serious than others and sometimes the cause is not known. Abdominal pain can be a sign that something is very wrong with the pregnancy or the pain may have nothing to do with the pregnancy. Always tell your health care provider if you have any abdominal pain. °Follow these instructions at home: °· Do not have sex or put anything in your vagina until your symptoms go away completely. °· Watch your abdominal pain for any changes. °· Get plenty of rest until your pain improves. °· Drink enough fluid to keep your urine clear or pale yellow. °· Take over-the-counter or prescription medicines only as told by your health care provider. °· Keep all follow-up visits as told by your health care provider. This is important. °Contact a health care provider if: °· You have a fever. °· Your pain gets worse or you have cramping. °· Your pain continues after resting. °Get help right away if: °· You are bleeding, leaking fluid, or passing tissue from the vagina. °· You have vomiting or diarrhea that does not go away. °· You have painful or bloody urination. °· You notice a decrease in your baby's movements. °· You feel very weak or faint. °· You have shortness of breath. °· You develop a severe headache with abdominal pain. °· You have abnormal vaginal discharge with abdominal pain. °This information is not intended to replace advice given to you by your health care provider. Make sure you discuss any questions you have with your health care provider. °Document Released: 11/28/2005 Document Revised: 09/08/2016 Document Reviewed: 06/27/2013 °Elsevier Interactive Patient Education © 2018 Elsevier Inc. ° °Safe Medications in Pregnancy  ° °Acne: °Benzoyl Peroxide °Salicylic Acid ° °Backache/Headache: °Tylenol: 2 regular strength every 4 hours OR °             2  Extra strength every 6 hours ° °Colds/Coughs/Allergies: °Benadryl (alcohol free) 25 mg every 6 hours as needed °Breath right strips °Claritin °Cepacol throat lozenges °Chloraseptic throat spray °Cold-Eeze- up to three times per day °Cough drops, alcohol free °Flonase (by prescription only) °Guaifenesin °Mucinex °Robitussin DM (plain only, alcohol free) °Saline nasal spray/drops °Sudafed (pseudoephedrine) & Actifed ** use only after [redacted] weeks gestation and if you do not have high blood pressure °Tylenol °Vicks Vaporub °Zinc lozenges °Zyrtec  ° °Constipation: °Colace °Ducolax suppositories °Fleet enema °Glycerin suppositories °Metamucil °Milk of magnesia °Miralax °Senokot °Smooth move tea ° °Diarrhea: °Kaopectate °Imodium A-D ° °*NO pepto Bismol ° °Hemorrhoids: °Anusol °Anusol HC °Preparation H °Tucks ° °Indigestion: °Tums °Maalox °Mylanta °Zantac  °Pepcid ° °Insomnia: °Benadryl (alcohol free) 25mg every 6 hours as needed °Tylenol PM °Unisom, no Gelcaps ° °Leg Cramps: °Tums °MagGel ° °Nausea/Vomiting:  °Bonine °Dramamine °Emetrol °Ginger extract °Sea bands °Meclizine  °Nausea medication to take during pregnancy:  °Unisom (doxylamine succinate 25 mg tablets) Take one tablet daily at bedtime. If symptoms are not adequately controlled, the dose can be increased to a maximum recommended dose of two tablets daily (1/2 tablet in the morning, 1/2 tablet mid-afternoon and one at bedtime). °Vitamin B6 100mg tablets. Take one tablet twice a day (up to 200 mg per day). ° °Skin Rashes: °Aveeno products °Benadryl cream or 25mg every 6 hours as needed °Calamine Lotion °1% cortisone cream ° °Yeast infection: °Gyne-lotrimin 7 °Monistat 7 ° ° °**If taking multiple medications, please check labels to   avoid duplicating the same active ingredients °**take medication as directed on the label °** Do not exceed 4000 mg of tylenol in 24 hours °**Do not take medications that contain aspirin or ibuprofen ° ° ° °[facility]    ° ° °[facility]       Phone: [phone] ° °[facility] [place] Phone: [phone] ° °[facility] [place]  Phone: [phone] ° °[facility] [place]  Phone: [phone] ° °[facility] [place]  Phone: [phone] ° °[facility]       Phone: [phone] ° °[facility]    Phone: [phone]  ° °[facility]    Phone: [phone] ° °[facility]    Phone: [phone] ° °[facility]    Phone: [phone] ° °[facility]    Phone: [phone] ° °[facility]       Phone: [phone]  ° °[facility]  Phone: [phone] ° ° [facility]    Phone: [phone] ° °[facility]   Phone: [phone] ° °[facility]      Phone: [phone] ° °[facility]    Phone: [phone] ° ° °

## 2017-12-18 LAB — GC/CHLAMYDIA PROBE AMP (~~LOC~~) NOT AT ARMC
CHLAMYDIA, DNA PROBE: NEGATIVE
NEISSERIA GONORRHEA: NEGATIVE

## 2017-12-30 ENCOUNTER — Inpatient Hospital Stay (HOSPITAL_COMMUNITY)
Admission: AD | Admit: 2017-12-30 | Discharge: 2017-12-30 | Disposition: A | Discharge status: Home / Self Care | Payer: Managed Care, Other (non HMO) | Source: Ambulatory Visit | Attending: Obstetrics and Gynecology | Admitting: Obstetrics and Gynecology

## 2017-12-30 ENCOUNTER — Other Ambulatory Visit: Payer: Self-pay

## 2017-12-30 ENCOUNTER — Encounter (HOSPITAL_COMMUNITY): Payer: Self-pay | Admitting: *Deleted

## 2017-12-30 DIAGNOSIS — Z87891 Personal history of nicotine dependence: Secondary | ICD-10-CM | POA: Diagnosis not present

## 2017-12-30 DIAGNOSIS — O219 Vomiting of pregnancy, unspecified: Secondary | ICD-10-CM

## 2017-12-30 DIAGNOSIS — Z3A10 10 weeks gestation of pregnancy: Secondary | ICD-10-CM | POA: Insufficient documentation

## 2017-12-30 DIAGNOSIS — O210 Mild hyperemesis gravidarum: Secondary | ICD-10-CM | POA: Diagnosis present

## 2017-12-30 LAB — URINALYSIS, ROUTINE W REFLEX MICROSCOPIC
Bilirubin Urine: NEGATIVE
GLUCOSE, UA: NEGATIVE mg/dL
Hgb urine dipstick: NEGATIVE
Ketones, ur: NEGATIVE mg/dL
LEUKOCYTES UA: NEGATIVE
Nitrite: NEGATIVE
PH: 5 (ref 5.0–8.0)
Protein, ur: NEGATIVE mg/dL
SPECIFIC GRAVITY, URINE: 1.02 (ref 1.005–1.030)

## 2017-12-30 MED ORDER — ONDANSETRON 4 MG PO TBDP: 4.0000 mg | ORAL_TABLET | Freq: Three times a day (TID) | ORAL | 2 refills | Status: DC | PRN

## 2017-12-30 MED ORDER — ONDANSETRON 4 MG PO TBDP
4.0000 mg | ORAL_TABLET | Freq: Once | ORAL | Status: AC
  Administered 2017-12-30: 4 mg via ORAL
  Filled 2017-12-30: qty 1

## 2017-12-30 NOTE — MAU Note (Signed)
Having abd pain, lightheaded, headaches, tingling fingers both hands at times.Denies vag bleeding. Having some d/c which I have had. White in color and sometimes has odor. Have been tested for BV and negative

## 2017-12-30 NOTE — Discharge Instructions (Signed)

## 2017-12-30 NOTE — MAU Provider Note (Signed)
History   G3P1011 @ 10.4 wks in with nausea and vomiting of preg. Taking reglan but is not helping. Has to work so wants something that wil  Not make her sleepy.  CSN: [id number]  Arrival date & time 12/30/17  1910   None     Chief Complaint  Patient presents with  . Abdominal Pain  . Dizziness    HPI  Past Medical History:  Diagnosis Date  . BV (bacterial vaginosis)   . Medical history non-contributory     Past Surgical History:  Procedure Laterality Date  . NO PAST SURGERIES      History reviewed. No pertinent family history.  Social History   Tobacco Use  . Smoking status: Former Smoker    Packs/day: 0.00    Last attempt to quit: 12/12/2017    Years since quitting: 0.0  . Smokeless tobacco: Never Used  Substance Use Topics  . Alcohol use: No    Frequency: Never  . Drug use: No    OB History    Gravida Para Term Preterm AB Living   3 1 1   1 1    SAB TAB Ectopic Multiple Live Births   1       1      Review of Systems  Constitutional: Negative.   HENT: Negative.   Eyes: Negative.   Respiratory: Negative.   Cardiovascular: Negative.   Gastrointestinal: Positive for nausea and vomiting.  Endocrine: Negative.   Genitourinary: Negative.   Musculoskeletal: Negative.   Skin: Negative.   Allergic/Immunologic: Negative.   Neurological: Negative.   Hematological: Negative.   Psychiatric/Behavioral: Negative.     Allergies  Patient has no known allergies.  Home Medications    BP 124/77 (BP Location: Right Arm)   Pulse 88   Temp 99.4 F (37.4 C)   Resp 18   Ht 5\' 5"  (1.651 m)   Wt 158 lb (71.7 kg)   LMP 10/17/2017 (Exact Date)   BMI 26.29 kg/m   Physical Exam  Constitutional: She is oriented to person, place, and time. She appears well-developed and well-nourished.  HENT:  Head: Normocephalic.  Eyes: Pupils are equal, round, and reactive to light.  Neck: Normal range of motion.  Cardiovascular: Normal rate, regular rhythm, normal heart  sounds and intact distal pulses.  Pulmonary/Chest: Effort normal and breath sounds normal.  Abdominal: Soft. Bowel sounds are normal.  Genitourinary: Vagina normal and uterus normal.  Musculoskeletal: Normal range of motion.  Neurological: She is alert and oriented to person, place, and time. She has normal reflexes.  Skin: Skin is warm and dry.  Psychiatric: She has a normal mood and affect. Her behavior is normal. Judgment and thought content normal.    MAU Course  Procedures (including critical care time)  Labs Reviewed  URINALYSIS, ROUTINE W REFLEX MICROSCOPIC   No results found.   1. Nausea and vomiting of pregnancy, antepartum       MDM  VSS, FHR 159 st and reg with doppler. zofran 4 mg ODT.  Pt taking po well will d/c home

## 2018-01-01 ENCOUNTER — Encounter (HOSPITAL_COMMUNITY): Payer: Self-pay | Admitting: *Deleted

## 2018-01-01 ENCOUNTER — Inpatient Hospital Stay (HOSPITAL_COMMUNITY)
Admission: AD | Admit: 2018-01-01 | Discharge: 2018-01-01 | Disposition: A | Discharge status: Home / Self Care | Payer: Managed Care, Other (non HMO) | Source: Ambulatory Visit | Attending: Obstetrics and Gynecology | Admitting: Obstetrics and Gynecology

## 2018-01-01 DIAGNOSIS — Z87891 Personal history of nicotine dependence: Secondary | ICD-10-CM | POA: Diagnosis not present

## 2018-01-01 DIAGNOSIS — Z3A10 10 weeks gestation of pregnancy: Secondary | ICD-10-CM | POA: Diagnosis not present

## 2018-01-01 DIAGNOSIS — O99611 Diseases of the digestive system complicating pregnancy, first trimester: Secondary | ICD-10-CM | POA: Insufficient documentation

## 2018-01-01 DIAGNOSIS — O26891 Other specified pregnancy related conditions, first trimester: Secondary | ICD-10-CM | POA: Diagnosis present

## 2018-01-01 DIAGNOSIS — R1010 Upper abdominal pain, unspecified: Secondary | ICD-10-CM | POA: Diagnosis present

## 2018-01-01 DIAGNOSIS — Z79899 Other long term (current) drug therapy: Secondary | ICD-10-CM | POA: Diagnosis not present

## 2018-01-01 DIAGNOSIS — O219 Vomiting of pregnancy, unspecified: Secondary | ICD-10-CM | POA: Diagnosis not present

## 2018-01-01 DIAGNOSIS — R112 Nausea with vomiting, unspecified: Secondary | ICD-10-CM

## 2018-01-01 DIAGNOSIS — K219 Gastro-esophageal reflux disease without esophagitis: Secondary | ICD-10-CM | POA: Diagnosis not present

## 2018-01-01 MED ORDER — RANITIDINE HCL 150 MG PO TABS: 150.0000 mg | ORAL_TABLET | Freq: Two times a day (BID) | ORAL | 0 refills | Status: DC

## 2018-01-01 MED ORDER — ONDANSETRON 4 MG PO TBDP: 4.0000 mg | ORAL_TABLET | Freq: Three times a day (TID) | ORAL | 0 refills | Status: DC | PRN

## 2018-01-01 NOTE — MAU Provider Note (Signed)
History     CSN: [id number]  Arrival date and time: 01/01/18 1653   First Provider Initiated Contact with Patient 01/01/18 1837      Chief Complaint  Patient presents with  . Abdominal Pain   HPI   Rebecca Bradley is a 37 y.o. female G3P1011 @ 1935w6d here in MAU with upper abdominal pain and acute onset of nausea and vomiting. States she woke up this afternoon, and drank cranberry juice and immediately started having upper abdominal pain, and vomiting. States she vomited 4x and then it stopped. She called 911. Currently she has no pain, and no N/V. States she feels it was the juice that cause GERD symptoms, as she has had this in the past. No diarrhea. No vaginal bleeding.   OB History    Gravida Para Term Preterm AB Living   3 1 1   1 1    SAB TAB Ectopic Multiple Live Births   1       1      Past Medical History:  Diagnosis Date  . BV (bacterial vaginosis)   . Medical history non-contributory     Past Surgical History:  Procedure Laterality Date  . NO PAST SURGERIES      History reviewed. No pertinent family history.  Social History   Tobacco Use  . Smoking status: Former Smoker    Packs/day: 0.00    Last attempt to quit: 12/12/2017    Years since quitting: 0.0  . Smokeless tobacco: Never Used  Substance Use Topics  . Alcohol use: No    Frequency: Never  . Drug use: No    Allergies: No Known Allergies  Medications Prior to Admission  Medication Sig Dispense Refill Last Dose  . metoCLOPramide (REGLAN) 10 MG tablet Take 1 tablet (10 mg total) by mouth every 6 (six) hours as needed for nausea (nausea/headache). 30 tablet 1   . ondansetron (ZOFRAN ODT) 4 MG disintegrating tablet Take 1 tablet (4 mg total) by mouth every 8 (eight) hours as needed for nausea or vomiting. 40 tablet 2   . polyethylene glycol powder (GLYCOLAX/MIRALAX) powder Take 17 g by mouth daily as needed for moderate constipation. 500 g 2   . Prenatal Vit-Fe Fumarate-FA (PRENATAL COMPLETE)  14-0.4 MG TABS Take 1 tablet by mouth daily. 60 each 0    Results for orders placed or performed during the hospital encounter of 12/30/17 (from the past 48 hour(s))  Urinalysis, Routine w reflex microscopic     Status: Abnormal   Collection Time: 12/30/17  7:25 PM  Result Value Ref Range   Color, Urine YELLOW YELLOW   APPearance HAZY (A) CLEAR   Specific Gravity, Urine 1.020 1.005 - 1.030   pH 5.0 5.0 - 8.0   Glucose, UA NEGATIVE NEGATIVE mg/dL   Hgb urine dipstick NEGATIVE NEGATIVE   Bilirubin Urine NEGATIVE NEGATIVE   Ketones, ur NEGATIVE NEGATIVE mg/dL   Protein, ur NEGATIVE NEGATIVE mg/dL   Nitrite NEGATIVE NEGATIVE   Leukocytes, UA NEGATIVE NEGATIVE    Review of Systems  Gastrointestinal: Positive for nausea and vomiting.  Genitourinary: Negative for vaginal bleeding, vaginal discharge and vaginal pain.   Physical Exam   Blood pressure 123/82, pulse 74, temperature (!) 97.2 F (36.2 C), temperature source Oral, resp. rate 18, last menstrual period 10/17/2017, SpO2 100 %.  Physical Exam  Constitutional: She is oriented to person, place, and time. She appears well-developed and well-nourished. No distress.  GI: Normal appearance. There is generalized tenderness. There is  no rigidity, no rebound and no guarding.  Musculoskeletal: Normal range of motion.  Neurological: She is alert and oriented to person, place, and time.  Skin: Skin is warm. She is not diaphoretic.  Psychiatric: Her behavior is normal.   MAU Course  Procedures  None  MDM  UA + fetal heart tones via doppler   Assessment and Plan   A:  1. Gastroesophageal reflux disease, esophagitis presence not specified   2. Non-intractable vomiting with nausea, unspecified vomiting type     P:  Discharge home in stable condition Patient feeling much better without intervention Rx: Zantac BID Avoid high acidic drinks, fried foods Return to MAU if symptoms worsen

## 2018-01-01 NOTE — Discharge Instructions (Signed)
Food Choices for Gastroesophageal Reflux Disease, Adult When you have gastroesophageal reflux disease (GERD), the foods you eat and your eating habits are very important. Choosing the right foods can help ease your discomfort. What guidelines do I need to follow?  Choose fruits, vegetables, whole grains, and low-fat dairy products.  Choose low-fat meat, fish, and poultry.  Limit fats such as oils, salad dressings, butter, nuts, and avocado.  Keep a food diary. This helps you identify foods that cause symptoms.  Avoid foods that cause symptoms. These may be different for everyone.  Eat small meals often instead of 3 large meals a day.  Eat your meals slowly, in a place where you are relaxed.  Limit fried foods.  Cook foods using methods other than frying.  Avoid drinking alcohol.  Avoid drinking large amounts of liquids with your meals.  Avoid bending over or lying down until 2-3 hours after eating. What foods are not recommended? These are some foods and drinks that may make your symptoms worse: Vegetables  Tomatoes. Tomato juice. Tomato and spaghetti sauce. Chili peppers. Onion and garlic. Horseradish. Fruits  Oranges, grapefruit, and lemon (fruit and juice). Meats  High-fat meats, fish, and poultry. This includes hot dogs, ribs, ham, sausage, salami, and bacon. Dairy  Whole milk and chocolate milk. Sour cream. Cream. Butter. Ice cream. Cream cheese. Drinks  Coffee and tea. Bubbly (carbonated) drinks or energy drinks. Condiments  Hot sauce. Barbecue sauce. Sweets/Desserts  Chocolate and cocoa. Donuts. Peppermint and spearmint. Fats and Oils  High-fat foods. This includes French fries and potato chips. Other  Vinegar. Strong spices. This includes black pepper, white pepper, red pepper, cayenne, curry powder, cloves, ginger, and chili powder. The items listed above may not be a complete list of foods and drinks to avoid. Contact your dietitian for more information.    This information is not intended to replace advice given to you by your health care provider. Make sure you discuss any questions you have with your health care provider. Document Released: 05/29/2012 Document Revised: 05/05/2016 Document Reviewed: 10/02/2013 Elsevier Interactive Patient Education  2017 [employer] Inc.  

## 2018-01-01 NOTE — MAU Note (Signed)
Patient states she had sudden onset of epigastric pain 10/10 that started around 1630 with nausea and vomiting as well.  Pt has vomited x4.  Denies diarrhea.  Pt states she last had something to eat last night and had been sleeping most of the day.

## 2018-01-27 ENCOUNTER — Inpatient Hospital Stay (HOSPITAL_COMMUNITY)
Admission: AD | Admit: 2018-01-27 | Discharge: 2018-01-27 | Disposition: A | Discharge status: Home / Self Care | Payer: Managed Care, Other (non HMO) | Source: Ambulatory Visit | Attending: Obstetrics and Gynecology | Admitting: Obstetrics and Gynecology

## 2018-01-27 ENCOUNTER — Encounter (HOSPITAL_COMMUNITY): Payer: Self-pay | Admitting: *Deleted

## 2018-01-27 DIAGNOSIS — R51 Headache: Secondary | ICD-10-CM | POA: Diagnosis not present

## 2018-01-27 DIAGNOSIS — Z87891 Personal history of nicotine dependence: Secondary | ICD-10-CM | POA: Insufficient documentation

## 2018-01-27 DIAGNOSIS — O26892 Other specified pregnancy related conditions, second trimester: Secondary | ICD-10-CM | POA: Insufficient documentation

## 2018-01-27 DIAGNOSIS — Z79899 Other long term (current) drug therapy: Secondary | ICD-10-CM | POA: Diagnosis not present

## 2018-01-27 DIAGNOSIS — Z3A14 14 weeks gestation of pregnancy: Secondary | ICD-10-CM | POA: Diagnosis not present

## 2018-01-27 LAB — URINALYSIS, ROUTINE W REFLEX MICROSCOPIC
BILIRUBIN URINE: NEGATIVE
GLUCOSE, UA: 150 mg/dL — AB
HGB URINE DIPSTICK: NEGATIVE
Ketones, ur: NEGATIVE mg/dL
Leukocytes, UA: NEGATIVE
Nitrite: NEGATIVE
PH: 5 (ref 5.0–8.0)
Protein, ur: NEGATIVE mg/dL
Specific Gravity, Urine: 1.017 (ref 1.005–1.030)

## 2018-01-27 LAB — GLUCOSE, CAPILLARY
Glucose-Capillary: 140 mg/dL — ABNORMAL HIGH (ref 65–99)
Glucose-Capillary: 80 mg/dL (ref 65–99)

## 2018-01-27 MED ORDER — ACETAMINOPHEN 500 MG PO TABS
1000.0000 mg | ORAL_TABLET | Freq: Once | ORAL | Status: AC
  Administered 2018-01-27: 1000 mg via ORAL
  Filled 2018-01-27: qty 2

## 2018-01-27 MED ORDER — PROMETHAZINE HCL 25 MG PO TABS: 12.5000 mg | ORAL_TABLET | Freq: Four times a day (QID) | ORAL | 0 refills | Status: DC | PRN

## 2018-01-27 MED ORDER — PROMETHAZINE HCL 25 MG PO TABS
25.0000 mg | ORAL_TABLET | Freq: Once | ORAL | Status: AC
  Administered 2018-01-27: 25 mg via ORAL
  Filled 2018-01-27: qty 1

## 2018-01-27 MED ORDER — IBUPROFEN 800 MG PO TABS: 800.0000 mg | ORAL_TABLET | Freq: Once | ORAL | Status: DC

## 2018-01-27 NOTE — MAU Note (Signed)
Pt reports headache off/on for 3 days, vomited x one today

## 2018-01-27 NOTE — MAU Provider Note (Signed)
History     CSN: [id number]  Arrival date and time: 01/27/18 1722   First Provider Initiated Contact with Patient 01/27/18 1753      Chief Complaint  Patient presents with  . Headache   Rebecca Bradley is a 37 y.o. G3P1011 at 2276w4d who presents with a headache. She states that the headache started "a few days" ago. She had a steak and cheese sub with lemonade prior to coming here. She reports that she did not eat all of it, and vomited right afterward. She had apples and cheese around 1400.    Headache   This is a new problem. The current episode started in the past 7 days. The problem occurs constantly. The problem has been unchanged. The pain is located in the frontal and retro-orbital region. The pain radiates to the right neck and left neck. The pain quality is similar to prior headaches. The pain is at a severity of 8/10. Associated symptoms include nausea and vomiting. Pertinent negatives include no fever. The symptoms are aggravated by caffeine withdrawal and bright light. She has tried acetaminophen (took 325mg  tylenol yesterday. Nothing since. ) for the symptoms. The treatment provided mild relief.   Past Medical History:  Diagnosis Date  . BV (bacterial vaginosis)   . Medical history non-contributory     Past Surgical History:  Procedure Laterality Date  . NO PAST SURGERIES      History reviewed. No pertinent family history.  Social History   Tobacco Use  . Smoking status: Former Smoker    Packs/day: 0.00    Last attempt to quit: 12/12/2017    Years since quitting: 0.1  . Smokeless tobacco: Never Used  Substance Use Topics  . Alcohol use: No    Frequency: Never  . Drug use: No    Allergies: No Known Allergies  Medications Prior to Admission  Medication Sig Dispense Refill Last Dose  . metoCLOPramide (REGLAN) 10 MG tablet Take 1 tablet (10 mg total) by mouth every 6 (six) hours as needed for nausea (nausea/headache). 30 tablet 1   . ondansetron (ZOFRAN ODT) 4  MG disintegrating tablet Take 1 tablet (4 mg total) by mouth every 8 (eight) hours as needed for nausea or vomiting. 20 tablet 0   . polyethylene glycol powder (GLYCOLAX/MIRALAX) powder Take 17 g by mouth daily as needed for moderate constipation. 500 g 2   . Prenatal Vit-Fe Fumarate-FA (PRENATAL COMPLETE) 14-0.4 MG TABS Take 1 tablet by mouth daily. 60 each 0   . ranitidine (ZANTAC) 150 MG tablet Take 1 tablet (150 mg total) by mouth 2 (two) times daily. 60 tablet 0     Review of Systems  Constitutional: Negative for chills and fever.  Gastrointestinal: Positive for nausea and vomiting.  Genitourinary: Negative for dysuria, frequency, pelvic pain, vaginal bleeding and vaginal discharge.  Neurological: Positive for headaches.   Physical Exam   Blood pressure 123/74, pulse 93, temperature 98.3 F (36.8 C), temperature source Oral, resp. rate 15, height 5\' 5"  (1.651 m), weight 160 lb (72.6 kg), last menstrual period 10/17/2017, SpO2 100 %.  Physical Exam  Nursing note and vitals reviewed. Constitutional: She is oriented to person, place, and time. She appears well-developed and well-nourished. No distress.  HENT:  Head: Normocephalic.  Cardiovascular: Normal rate.  Respiratory: Effort normal.  GI: Soft. There is no tenderness. There is no rebound.  Genitourinary:  Genitourinary Comments:  FHT 152 with doppler   Neurological: She is alert and oriented to person, place, and  time.  Skin: Skin is warm and dry.  Psychiatric: She has a normal mood and affect.   Results for orders placed or performed during the hospital encounter of 01/27/18 (from the past 24 hour(s))  Urinalysis, Routine w reflex microscopic     Status: Abnormal   Collection Time: 01/27/18  5:25 PM  Result Value Ref Range   Color, Urine YELLOW YELLOW   APPearance HAZY (A) CLEAR   Specific Gravity, Urine 1.017 1.005 - 1.030   pH 5.0 5.0 - 8.0   Glucose, UA 150 (A) NEGATIVE mg/dL   Hgb urine dipstick NEGATIVE NEGATIVE    Bilirubin Urine NEGATIVE NEGATIVE   Ketones, ur NEGATIVE NEGATIVE mg/dL   Protein, ur NEGATIVE NEGATIVE mg/dL   Nitrite NEGATIVE NEGATIVE   Leukocytes, UA NEGATIVE NEGATIVE  Glucose, capillary     Status: Abnormal   Collection Time: 01/27/18  6:00 PM  Result Value Ref Range   Glucose-Capillary 140 (H) 65 - 99 mg/dL    MAU Course  Procedures  MDM Patient has had tylenol and phenergan. She reports that her headache has improved.   Assessment and Plan   1. Headache in pregnancy, antepartum, second trimester   2. [redacted] weeks gestation of pregnancy    DC home Comfort measures reviewed  2nd Trimester precautions  RX: phenergan PRN #30, safe medication list given  Return to MAU as needed FU with OB as planned  Follow-up Information    [facility] Follow up.   Specialty:  Obstetrics and Gynecology Contact information: [address] [place] [address] [phone]           Thressa Sheller 01/27/2018, 5:56 PM

## 2018-01-27 NOTE — Discharge Instructions (Signed)
Prenatal Care Providers °[facility]    [facility] ° Phone- [phone]     Phone: [phone] °         °[facility]                      [facility] ° @[place]     Phone: [phone] ° Phone: [phone] °        [facility] °[facility]     Phone: [phone] ° Phone: [phone]   °        [facility] °[facility] [place]                hone: [phone] ° Phone: [phone] °        [facility] °Dr. Bernard Marshall      Phone: [phone] ° Phone: [phone] °        [facility] °[facility]                Phone: [phone] ° Women’s Health  ° Phone:[phone]    [facility] () °         Phone: [phone] °[facility] °  Phone: [phone] °Safe Medications in Pregnancy  ° °Acne: °Benzoyl Peroxide °Salicylic Acid ° °Backache/Headache: °Tylenol: 2 regular strength every 4 hours OR °             2 Extra strength every 6 hours ° °Colds/Coughs/Allergies: °Benadryl (alcohol free) 25 mg every 6 hours as needed °Breath right strips °Claritin °Cepacol throat lozenges °Chloraseptic throat spray °Cold-Eeze- up to three times per day °Cough drops, alcohol free °Flonase (by prescription only) °Guaifenesin °Mucinex °Robitussin DM (plain only, alcohol free) °Saline nasal spray/drops °Sudafed (pseudoephedrine) & Actifed ** use only after [redacted] weeks gestation and if you do not have high blood pressure °Tylenol °Vicks Vaporub °Zinc lozenges °Zyrtec  ° °Constipation: °Colace °Ducolax suppositories °Fleet enema °Glycerin suppositories °Metamucil °Milk of magnesia °Miralax °Senokot °Smooth move tea ° °Diarrhea: °Kaopectate °Imodium A-D ° °*NO pepto Bismol ° °Hemorrhoids: °Anusol °Anusol HC °Preparation H °Tucks ° °Indigestion: °Tums °Maalox °Mylanta °Zantac  °Pepcid ° °Insomnia: °Benadryl (alcohol free) 25mg every 6 hours as needed °Tylenol  PM °Unisom, no Gelcaps ° °Leg Cramps: °Tums °MagGel ° °Nausea/Vomiting:  °Bonine °Dramamine °Emetrol °Ginger extract °Sea bands °Meclizine  °Nausea medication to take during pregnancy:  °Unisom (doxylamine succinate 25 mg tablets) Take one tablet daily at bedtime. If symptoms are not adequately controlled, the dose can be increased to a maximum recommended dose of two tablets daily (1/2 tablet in the morning, 1/2 tablet mid-afternoon and one at bedtime). °Vitamin B6 100mg tablets. Take one tablet twice a day (up to 200 mg per day). ° °Skin Rashes: °Aveeno products °Benadryl cream or 25mg every 6 hours as needed °Calamine Lotion °1% cortisone cream ° °Yeast infection: °Gyne-lotrimin 7 °Monistat 7 ° ° °**If taking multiple medications, please check labels to avoid duplicating the same active ingredients °**take medication as directed on the label °** Do not exceed 4000 mg of tylenol in 24 hours °**Do not take medications that contain aspirin or ibuprofen ° ° ° ° °

## 2018-02-03 ENCOUNTER — Other Ambulatory Visit: Payer: Self-pay

## 2018-02-03 ENCOUNTER — Encounter (HOSPITAL_BASED_OUTPATIENT_CLINIC_OR_DEPARTMENT_OTHER): Payer: Self-pay | Admitting: *Deleted

## 2018-02-03 DIAGNOSIS — R109 Unspecified abdominal pain: Secondary | ICD-10-CM | POA: Diagnosis not present

## 2018-02-03 DIAGNOSIS — O9989 Other specified diseases and conditions complicating pregnancy, childbirth and the puerperium: Secondary | ICD-10-CM | POA: Diagnosis present

## 2018-02-03 DIAGNOSIS — Z5321 Procedure and treatment not carried out due to patient leaving prior to being seen by health care provider: Secondary | ICD-10-CM | POA: Insufficient documentation

## 2018-02-03 DIAGNOSIS — Z3A16 16 weeks gestation of pregnancy: Secondary | ICD-10-CM | POA: Insufficient documentation

## 2018-02-03 NOTE — ED Notes (Signed)
Fetal heart tones 136-obtained by Lupita Leashonna, RN
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
## 2018-02-04 ENCOUNTER — Emergency Department (HOSPITAL_BASED_OUTPATIENT_CLINIC_OR_DEPARTMENT_OTHER)
Admission: EM | Admit: 2018-02-04 | Discharge: 2018-02-04 | Disposition: A | Discharge status: Home / Self Care | Payer: Managed Care, Other (non HMO) | Attending: Emergency Medicine | Admitting: Emergency Medicine

## 2018-02-04 NOTE — ED Notes (Signed)
Registration reports pt was seen leaving department  

## 2018-02-15 ENCOUNTER — Encounter (HOSPITAL_COMMUNITY): Payer: Self-pay | Admitting: *Deleted

## 2018-02-15 ENCOUNTER — Inpatient Hospital Stay (HOSPITAL_COMMUNITY)
Admission: AD | Admit: 2018-02-15 | Discharge: 2018-02-15 | Disposition: A | Discharge status: Home / Self Care | Payer: Managed Care, Other (non HMO) | Source: Ambulatory Visit | Attending: Obstetrics & Gynecology | Admitting: Obstetrics & Gynecology

## 2018-02-15 DIAGNOSIS — Z87891 Personal history of nicotine dependence: Secondary | ICD-10-CM | POA: Diagnosis not present

## 2018-02-15 DIAGNOSIS — G44209 Tension-type headache, unspecified, not intractable: Secondary | ICD-10-CM | POA: Diagnosis not present

## 2018-02-15 DIAGNOSIS — O26892 Other specified pregnancy related conditions, second trimester: Secondary | ICD-10-CM | POA: Insufficient documentation

## 2018-02-15 DIAGNOSIS — R51 Headache: Secondary | ICD-10-CM | POA: Diagnosis present

## 2018-02-15 DIAGNOSIS — Z3A17 17 weeks gestation of pregnancy: Secondary | ICD-10-CM | POA: Diagnosis not present

## 2018-02-15 DIAGNOSIS — R040 Epistaxis: Secondary | ICD-10-CM | POA: Diagnosis not present

## 2018-02-15 LAB — URINALYSIS, ROUTINE W REFLEX MICROSCOPIC
Bilirubin Urine: NEGATIVE
GLUCOSE, UA: 150 mg/dL — AB
Hgb urine dipstick: NEGATIVE
KETONES UR: NEGATIVE mg/dL
LEUKOCYTES UA: NEGATIVE
NITRITE: NEGATIVE
PROTEIN: NEGATIVE mg/dL
Specific Gravity, Urine: 1.029 (ref 1.005–1.030)
pH: 5 (ref 5.0–8.0)

## 2018-02-15 MED ORDER — BUTALBITAL-APAP-CAFFEINE 50-325-40 MG PO TABS: 1.0000 | ORAL_TABLET | Freq: Four times a day (QID) | ORAL | 0 refills | Status: DC | PRN

## 2018-02-15 NOTE — MAU Note (Signed)
PT SAYS SHE  HAS H/A - STARTED 1 WEEK AGO. - TOOK TYLENOL AT 12NOON  REG  STR  2 TABS-   SOME RELIEF.    H/A COMING BACK-  RATES - 7 .      NOSE BLEEDS ON LEFT SIDE   X 2 WEEKS - BUT TONIGHT   SHE COUGHED UP A CLOT - SHE BROUGHT IT. .   NO BLEEDING NOW.   PNC   WITH DR DORN - IN [place] - PLANS  TO  SWITCH TO CLINIC  THIS WEEK.

## 2018-02-15 NOTE — MAU Provider Note (Signed)
History     CSN: [id number]  Arrival date and time: 02/15/18 2057   None     Chief Complaint  Patient presents with  . Headache   HPI Rebecca Bradley is a 37 y.o. G3P1011 at [redacted]w[redacted]d whom presents to MAU with complaints of headaches that have been present off and on for the last several weeks. Has been seen in MAU prior due to headache. Patient has tried tylenol which helps some but headache always comes back. Headaches have worsened in pregnancy. Patient endorses increased stress and worry recently in her life. Also states that she has had a nose bleed on the leftt for the last week. Believes she might have cut her nose with her nails. Last bleed was today. She noticed a clot of blood come out her mouth and was worried. Denies n/v, chest pain. Reflux, diarrhea, bruising, fevers, abdominal pain, vaginal bleeding.   Past Medical History:  Diagnosis Date  . BV (bacterial vaginosis)   . Medical history non-contributory     Past Surgical History:  Procedure Laterality Date  . NO PAST SURGERIES      No family history on file.  Social History   Tobacco Use  . Smoking status: Former Smoker    Packs/day: 0.00    Last attempt to quit: 12/12/2017    Years since quitting: 0.1  . Smokeless tobacco: Never Used  Substance Use Topics  . Alcohol use: No    Frequency: Never  . Drug use: No    Allergies: No Known Allergies  Medications Prior to Admission  Medication Sig Dispense Refill Last Dose  . Prenatal Vit-Fe Fumarate-FA (PRENATAL COMPLETE) 14-0.4 MG TABS Take 1 tablet by mouth daily. 60 each 0 Past Week at Unknown time  . metoCLOPramide (REGLAN) 10 MG tablet Take 1 tablet (10 mg total) by mouth every 6 (six) hours as needed for nausea (nausea/headache). 30 tablet 1   . ondansetron (ZOFRAN ODT) 4 MG disintegrating tablet Take 1 tablet (4 mg total) by mouth every 8 (eight) hours as needed for nausea or vomiting. 20 tablet 0   . polyethylene glycol powder (GLYCOLAX/MIRALAX) powder Take  17 g by mouth daily as needed for moderate constipation. 500 g 2   . promethazine (PHENERGAN) 25 MG tablet Take 0.5-1 tablets (12.5-25 mg total) by mouth every 6 (six) hours as needed. 30 tablet 0   . ranitidine (ZANTAC) 150 MG tablet Take 1 tablet (150 mg total) by mouth 2 (two) times daily. 60 tablet 0     Review of Systems  All systems reviewed and are negative for acute change except as noted in the HPI.  Physical Exam   Blood pressure 108/64, pulse 92, temperature 98.7 F (37.1 C), temperature source Oral, resp. rate 18, height 5\' 5"  (1.651 m), weight 75.2 kg (165 lb 12 oz), last menstrual period 10/17/2017.  Physical Exam  Nursing note and vitals reviewed. Constitutional: She is oriented to person, place, and time. She appears well-developed and well-nourished. No distress.  HENT:  Head: Normocephalic and atraumatic.  Left nare with mucosal laceration on superior surface that is hemostatic.  Eyes: Conjunctivae and EOM are normal. Pupils are equal, round, and reactive to light.  Neck: Normal range of motion. Neck supple.  Cardiovascular: Normal rate.  Respiratory: Effort normal.  GI: There is no tenderness. There is no guarding.  Musculoskeletal: Normal range of motion.  Neurological: She is alert and oriented to person, place, and time. No cranial nerve deficit. She exhibits normal muscle  tone.  Skin: Skin is warm and dry.  Psychiatric: She has a normal mood and affect.    MAU Course  Procedures  MDM UA unremarkable. Laceration in nose most likely due to picking with long nails. Reassurance given.  Declined medication for headache in MAU  Assessment and Plan  Tension headache  Bleeding from the nose  Discharge home ins table condition Avoid nose picking Fiorcet given for headache Discussed needing to improve stress to help with headache [facility]    Caryl AdaJazma Kerline Trahan, DO 02/15/2018, 10:31 PM

## 2018-02-15 NOTE — Discharge Instructions (Signed)

## 2018-02-28 ENCOUNTER — Encounter (HOSPITAL_COMMUNITY): Payer: Self-pay

## 2018-02-28 ENCOUNTER — Inpatient Hospital Stay (HOSPITAL_COMMUNITY)
Admission: AD | Admit: 2018-02-28 | Discharge: 2018-02-28 | Disposition: A | Discharge status: Home / Self Care | Payer: Managed Care, Other (non HMO) | Source: Ambulatory Visit | Attending: Obstetrics and Gynecology | Admitting: Obstetrics and Gynecology

## 2018-02-28 ENCOUNTER — Other Ambulatory Visit: Payer: Self-pay

## 2018-02-28 DIAGNOSIS — O219 Vomiting of pregnancy, unspecified: Secondary | ICD-10-CM | POA: Diagnosis not present

## 2018-02-28 DIAGNOSIS — R197 Diarrhea, unspecified: Secondary | ICD-10-CM | POA: Insufficient documentation

## 2018-02-28 DIAGNOSIS — O9989 Other specified diseases and conditions complicating pregnancy, childbirth and the puerperium: Secondary | ICD-10-CM | POA: Diagnosis not present

## 2018-02-28 DIAGNOSIS — O26892 Other specified pregnancy related conditions, second trimester: Secondary | ICD-10-CM | POA: Diagnosis present

## 2018-02-28 DIAGNOSIS — Z87891 Personal history of nicotine dependence: Secondary | ICD-10-CM | POA: Insufficient documentation

## 2018-02-28 DIAGNOSIS — Z79899 Other long term (current) drug therapy: Secondary | ICD-10-CM | POA: Insufficient documentation

## 2018-02-28 DIAGNOSIS — Z3A19 19 weeks gestation of pregnancy: Secondary | ICD-10-CM | POA: Insufficient documentation

## 2018-02-28 DIAGNOSIS — A0811 Acute gastroenteropathy due to Norwalk agent: Secondary | ICD-10-CM

## 2018-02-28 DIAGNOSIS — A0819 Acute gastroenteropathy due to other small round viruses: Secondary | ICD-10-CM | POA: Insufficient documentation

## 2018-02-28 DIAGNOSIS — R42 Dizziness and giddiness: Secondary | ICD-10-CM

## 2018-02-28 LAB — URINALYSIS, ROUTINE W REFLEX MICROSCOPIC
Bacteria, UA: NONE SEEN
Bilirubin Urine: NEGATIVE
GLUCOSE, UA: NEGATIVE mg/dL
Ketones, ur: NEGATIVE mg/dL
Leukocytes, UA: NEGATIVE
NITRITE: NEGATIVE
PH: 7 (ref 5.0–8.0)
Protein, ur: NEGATIVE mg/dL
SPECIFIC GRAVITY, URINE: 1.015 (ref 1.005–1.030)

## 2018-02-28 MED ORDER — ONDANSETRON 8 MG PO TBDP
8.0000 mg | ORAL_TABLET | Freq: Once | ORAL | Status: AC
  Administered 2018-02-28: 8 mg via ORAL
  Filled 2018-02-28: qty 1

## 2018-02-28 MED ORDER — ONDANSETRON 4 MG PO TBDP: 4.0000 mg | ORAL_TABLET | Freq: Three times a day (TID) | ORAL | 0 refills | Status: DC | PRN

## 2018-02-28 NOTE — MAU Note (Signed)
Pt presents to MAU with c/o vomiting and diarrhea that started x2 days ago. Pt has had dizziness off and on since yesterday. Pt denies VB and LOF. +FM

## 2018-02-28 NOTE — MAU Provider Note (Signed)
History     CSN: [id number]  Arrival date and time: 02/28/18 1222   First Provider Initiated Contact with Patient 02/28/18 1313      Chief Complaint  Patient presents with  . Emesis  . Dizziness   HPI   Ms.Astrid DraftsLasha C Hillock is 37 y.o. female 683P1011 @ 6957w1d here in MAU with complaints of Nausea, vomiting and diarrhea. Says the symptoms started on Monday. The diarrhea is liquid and she is going 3-4 times per day since Monday. No one else in her house is sick. No fever. Has not been taking medication for the symptoms.   OB History    Gravida Para Term Preterm AB Living   3 1 1   1 1    SAB TAB Ectopic Multiple Live Births   1       1      Past Medical History:  Diagnosis Date  . BV (bacterial vaginosis)   . Medical history non-contributory     Past Surgical History:  Procedure Laterality Date  . NO PAST SURGERIES      History reviewed. No pertinent family history.  Social History   Tobacco Use  . Smoking status: Former Smoker    Packs/day: 0.00    Last attempt to quit: 12/12/2017    Years since quitting: 0.2  . Smokeless tobacco: Never Used  Substance Use Topics  . Alcohol use: No    Frequency: Never  . Drug use: No    Allergies: No Known Allergies  Medications Prior to Admission  Medication Sig Dispense Refill Last Dose  . butalbital-acetaminophen-caffeine (FIORICET, ESGIC) 50-325-40 MG tablet Take 1-2 tablets by mouth every 6 (six) hours as needed for headache or migraine. 15 tablet 0   . metoCLOPramide (REGLAN) 10 MG tablet Take 1 tablet (10 mg total) by mouth every 6 (six) hours as needed for nausea (nausea/headache). 30 tablet 1   . ondansetron (ZOFRAN ODT) 4 MG disintegrating tablet Take 1 tablet (4 mg total) by mouth every 8 (eight) hours as needed for nausea or vomiting. 20 tablet 0   . polyethylene glycol powder (GLYCOLAX/MIRALAX) powder Take 17 g by mouth daily as needed for moderate constipation. 500 g 2   . Prenatal Vit-Fe Fumarate-FA (PRENATAL  COMPLETE) 14-0.4 MG TABS Take 1 tablet by mouth daily. 60 each 0 Past Week at Unknown time  . promethazine (PHENERGAN) 25 MG tablet Take 0.5-1 tablets (12.5-25 mg total) by mouth every 6 (six) hours as needed. 30 tablet 0   . ranitidine (ZANTAC) 150 MG tablet Take 1 tablet (150 mg total) by mouth 2 (two) times daily. 60 tablet 0    Results for orders placed or performed during the hospital encounter of 02/28/18 (from the past 48 hour(s))  Urinalysis, Routine w reflex microscopic     Status: Abnormal   Collection Time: 02/28/18 12:39 PM  Result Value Ref Range   Color, Urine YELLOW YELLOW   APPearance CLEAR CLEAR   Specific Gravity, Urine 1.015 1.005 - 1.030   pH 7.0 5.0 - 8.0   Glucose, UA NEGATIVE NEGATIVE mg/dL   Hgb urine dipstick SMALL (A) NEGATIVE   Bilirubin Urine NEGATIVE NEGATIVE   Ketones, ur NEGATIVE NEGATIVE mg/dL   Protein, ur NEGATIVE NEGATIVE mg/dL   Nitrite NEGATIVE NEGATIVE   Leukocytes, UA NEGATIVE NEGATIVE   RBC / HPF 0-5 0 - 5 RBC/hpf   WBC, UA 0-5 0 - 5 WBC/hpf   Bacteria, UA NONE SEEN NONE SEEN   Squamous Epithelial / LPF  0-5 (A) NONE SEEN   Mucus PRESENT     Comment: Performed at [facility], [address], [place], [place] [address]    Review of Systems  Gastrointestinal: Positive for constipation, diarrhea and vomiting.  Neurological: Positive for dizziness and light-headedness.   Physical Exam   Blood pressure 104/66, pulse 85, temperature 98.7 F (37.1 C), temperature source Oral, height 5\' 5"  (1.651 m), weight 160 lb 12 oz (72.9 kg), last menstrual period 10/17/2017, SpO2 99 %.  Physical Exam  Constitutional: She is oriented to person, place, and time. She appears well-developed and well-nourished. No distress.  HENT:  Head: Normocephalic.  Eyes: Pupils are equal, round, and reactive to light.  Respiratory: Effort normal.  GI: Soft. She exhibits no distension. There is no tenderness. There is no rebound and no guarding.  Musculoskeletal:  Normal range of motion.  Neurological: She is alert and oriented to person, place, and time.  Skin: Skin is warm. She is not diaphoretic.  Psychiatric: Her behavior is normal.    MAU Course  Procedures  None  MDM  + fetal heart tones via doppler  Zofran given in MAU   Assessment and Plan   A:  1. Norovirus   2. Dizziness, nonspecific     P:  Discharge home with strict return precautions Rx: Zofran, Zantac Return to MAU if symptoms worsen Small, frequent meals Increase oral fluids  Eleanora Guinyard, Harolyn Rutherford, NP 03/01/2018 2:09 PM

## 2018-02-28 NOTE — Discharge Instructions (Signed)

## 2018-04-17 ENCOUNTER — Emergency Department (HOSPITAL_BASED_OUTPATIENT_CLINIC_OR_DEPARTMENT_OTHER)
Admission: EM | Admit: 2018-04-17 | Discharge: 2018-04-17 | Disposition: A | Discharge status: Home / Self Care | Payer: Managed Care, Other (non HMO) | Attending: Emergency Medicine | Admitting: Emergency Medicine

## 2018-04-17 ENCOUNTER — Encounter (HOSPITAL_BASED_OUTPATIENT_CLINIC_OR_DEPARTMENT_OTHER): Payer: Self-pay | Admitting: *Deleted

## 2018-04-17 ENCOUNTER — Other Ambulatory Visit: Payer: Self-pay

## 2018-04-17 DIAGNOSIS — O26892 Other specified pregnancy related conditions, second trimester: Secondary | ICD-10-CM | POA: Insufficient documentation

## 2018-04-17 DIAGNOSIS — R102 Pelvic and perineal pain: Secondary | ICD-10-CM | POA: Diagnosis not present

## 2018-04-17 DIAGNOSIS — Z3A27 27 weeks gestation of pregnancy: Secondary | ICD-10-CM | POA: Diagnosis not present

## 2018-04-17 NOTE — ED Triage Notes (Signed)
Pt is [redacted] weeks pregnant. Vaginal pressure. She is having a discharge today. She has not felt the baby move today.

## 2018-04-17 NOTE — ED Notes (Signed)
Pt seen in car pulling out of ER parking lot by this rn. Pt did not notify this rn that she was leaving.

## 2018-06-17 ENCOUNTER — Encounter (HOSPITAL_COMMUNITY): Payer: Self-pay | Admitting: *Deleted

## 2018-06-17 ENCOUNTER — Inpatient Hospital Stay (HOSPITAL_COMMUNITY)
Admission: AD | Admit: 2018-06-17 | Discharge: 2018-06-17 | Disposition: A | Discharge status: Home / Self Care | Payer: Managed Care, Other (non HMO) | Source: Ambulatory Visit | Attending: Obstetrics and Gynecology | Admitting: Obstetrics and Gynecology

## 2018-06-17 ENCOUNTER — Other Ambulatory Visit: Payer: Self-pay

## 2018-06-17 DIAGNOSIS — Z87891 Personal history of nicotine dependence: Secondary | ICD-10-CM | POA: Diagnosis not present

## 2018-06-17 DIAGNOSIS — B9689 Other specified bacterial agents as the cause of diseases classified elsewhere: Secondary | ICD-10-CM

## 2018-06-17 DIAGNOSIS — N760 Acute vaginitis: Secondary | ICD-10-CM | POA: Diagnosis not present

## 2018-06-17 DIAGNOSIS — R102 Pelvic and perineal pain: Secondary | ICD-10-CM

## 2018-06-17 DIAGNOSIS — O479 False labor, unspecified: Secondary | ICD-10-CM

## 2018-06-17 DIAGNOSIS — O26899 Other specified pregnancy related conditions, unspecified trimester: Secondary | ICD-10-CM

## 2018-06-17 DIAGNOSIS — O23593 Infection of other part of genital tract in pregnancy, third trimester: Secondary | ICD-10-CM | POA: Diagnosis not present

## 2018-06-17 DIAGNOSIS — Z3A34 34 weeks gestation of pregnancy: Secondary | ICD-10-CM | POA: Insufficient documentation

## 2018-06-17 DIAGNOSIS — O4703 False labor before 37 completed weeks of gestation, third trimester: Secondary | ICD-10-CM | POA: Insufficient documentation

## 2018-06-17 LAB — URINALYSIS, ROUTINE W REFLEX MICROSCOPIC
BILIRUBIN URINE: NEGATIVE
Glucose, UA: NEGATIVE mg/dL
HGB URINE DIPSTICK: NEGATIVE
Ketones, ur: NEGATIVE mg/dL
Leukocytes, UA: NEGATIVE
Nitrite: NEGATIVE
PH: 6 (ref 5.0–8.0)
Protein, ur: NEGATIVE mg/dL
SPECIFIC GRAVITY, URINE: 1.012 (ref 1.005–1.030)

## 2018-06-17 LAB — WET PREP, GENITAL
SPERM: NONE SEEN
TRICH WET PREP: NONE SEEN
Yeast Wet Prep HPF POC: NONE SEEN

## 2018-06-17 MED ORDER — METRONIDAZOLE 0.75 % VA GEL: VAGINAL | 0 refills | Status: DC

## 2018-06-17 NOTE — MAU Provider Note (Signed)
History     CSN: [id number]  Arrival date & time 06/17/18  1636   First Provider Initiated Contact with Patient 06/17/18 1708      Chief Complaint  Patient presents with  . Pelvic Pain  . Vaginal Bleeding    HPI  Rebecca Bradley is a 37 y.o. G3P1011 at [redacted]w[redacted]d who presents to MAU with chief complaint of increasing pelvic pressure and increased vaginal discharge for the past three days. Denies vaginal bleeding, leaking of fluid, decreased fetal movement, fever, falls, or recent illness.  She receives her prenatal care from [facility] in [facility]. Endorses routine prenatal care and no complications this pregnancy.  Pelvic pressure This is a new problem. Patient describes intermittent "sharp" pain in the middle of her pelvis, rates 10/10. Pain is "really bad then completely gone". Does not radiate, no secondary pain sites. Aggravated by walking and repositioning, no alleviating factors.  Vaginal discharge This is a new problem. Describes vaginal discharge as thicker than normal, not foul smelling.  Past Medical History:  Diagnosis Date  . BV (bacterial vaginosis)   . Medical history non-contributory     Past Surgical History:  Procedure Laterality Date  . NO PAST SURGERIES      History reviewed. No pertinent family history.  Social History   Tobacco Use  . Smoking status: Former Smoker    Packs/day: 0.00    Last attempt to quit: 12/12/2017    Years since quitting: 0.5  . Smokeless tobacco: Never Used  Substance Use Topics  . Alcohol use: No    Frequency: Never  . Drug use: No    OB History    Gravida  3   Para  1   Term  1   Preterm      AB  1   Living  1     SAB  1   TAB      Ectopic      Multiple      Live Births  1           Review of Systems  Gastrointestinal: Negative for abdominal pain, nausea and vomiting.  Genitourinary: Positive for pelvic pain, vaginal discharge and vaginal pain. Negative for difficulty urinating and  vaginal bleeding.  Neurological: Negative for headaches.  All other systems reviewed and are negative.   Allergies  Patient has no known allergies.  Home Medications    BP 123/74 (BP Location: Right Arm)   Pulse 97   Temp 98.1 F (36.7 C) (Oral)   Resp 18   Ht 5\' 5"  (1.651 m)   Wt 188 lb (85.3 kg)   LMP 10/17/2017 (Exact Date)   BMI 31.28 kg/m   Physical Exam  Constitutional: She appears well-developed and well-nourished.  Cardiovascular: Normal rate, regular rhythm, normal heart sounds and intact distal pulses.  Pulmonary/Chest: Effort normal and breath sounds normal.  Abdominal:  Gravid  Genitourinary: Uterus normal. Vaginal discharge found.  Genitourinary Comments: Thick white vaginal discharge noted on bimanual exam Cervix is closed/thick/posterior  Skin: Capillary refill takes less than 2 seconds.  Psychiatric: She has a normal mood and affect. Her behavior is normal. Judgment and thought content normal.  Nursing note and vitals reviewed.   MAU Course/MDM  Procedures    Reactive NST: Baseline 135, moderate variability, positive 15x15 accels, no decels Toco: ctx 4-7 min, not felt by patient, closed cervix No history preterm delivery  Patient Vitals for the past 24 hrs:  BP Temp Temp  src Pulse Resp Height Weight  06/17/18 1647 123/74 98.1 F (36.7 C) Oral 97 18 5\' 5"  (1.651 m) 188 lb (85.3 kg)    Labs Reviewed  WET PREP, GENITAL  URINALYSIS, ROUTINE W REFLEX MICROSCOPIC  GC/CHLAMYDIA PROBE AMP ([place]) NOT AT [facility]   Results for orders placed or performed during the hospital encounter of 06/17/18 (from the past 24 hour(s))  Urinalysis, Routine w reflex microscopic     Status: None   Collection Time: 06/17/18  4:54 PM  Result Value Ref Range   Color, Urine YELLOW YELLOW   APPearance CLEAR CLEAR   Specific Gravity, Urine 1.012 1.005 - 1.030   pH 6.0 5.0 - 8.0   Glucose, UA NEGATIVE NEGATIVE mg/dL   Hgb urine dipstick NEGATIVE NEGATIVE   Bilirubin  Urine NEGATIVE NEGATIVE   Ketones, ur NEGATIVE NEGATIVE mg/dL   Protein, ur NEGATIVE NEGATIVE mg/dL   Nitrite NEGATIVE NEGATIVE   Leukocytes, UA NEGATIVE NEGATIVE  Wet prep, genital     Status: Abnormal   Collection Time: 06/17/18  5:16 PM  Result Value Ref Range   Yeast Wet Prep HPF POC NONE SEEN NONE SEEN   Trich, Wet Prep NONE SEEN NONE SEEN   Clue Cells Wet Prep HPF POC PRESENT (A) NONE SEEN   WBC, Wet Prep HPF POC FEW (A) NONE SEEN   Sperm NONE SEEN    Meds ordered this encounter  Medications  . metroNIDAZOLE (METROGEL VAGINAL) 0.75 % vaginal gel    Sig: Apply 5g to vagina at bedtime for 5 days    Dispense:  70 g    Refill:  0    Order Specific Question:   Supervising Provider    Answer:   Reva BoresPRATT, Rebecca S [[id number]]     ASSESSMENT & PLAN --37 y.o. G3P1011 at 852w5d  --Reactive NST --Bacterial Vaginosis, patient prefers vaginal gel --Round ligament pain and pubic symphysis pain in pregnancy --PO hydration with water, warm bath or pool, and abdominal binder discussed with pt --Braxton Hicks contractions --Discharge home in stable condition  Rebecca Bradley, [place] 06/17/18  6:20 PM

## 2018-06-17 NOTE — MAU Note (Signed)
Lots of pelvic pressure, getting worse since yesterday  Sharp vaginal pains  Both are worse with walking  No vag bleeding, no LOF, +FM  Increase vag discharge, no odor, white, thick, no itching

## 2018-06-17 NOTE — Discharge Instructions (Signed)

## 2018-06-18 LAB — GC/CHLAMYDIA PROBE AMP (~~LOC~~) NOT AT ARMC
Chlamydia: NEGATIVE
Neisseria Gonorrhea: NEGATIVE

## 2018-06-19 LAB — CULTURE, OB URINE: Culture: NO GROWTH

## 2018-06-30 ENCOUNTER — Encounter (HOSPITAL_BASED_OUTPATIENT_CLINIC_OR_DEPARTMENT_OTHER): Payer: Self-pay | Admitting: *Deleted

## 2018-06-30 ENCOUNTER — Other Ambulatory Visit: Payer: Self-pay

## 2018-06-30 ENCOUNTER — Emergency Department (HOSPITAL_BASED_OUTPATIENT_CLINIC_OR_DEPARTMENT_OTHER)
Admission: EM | Admit: 2018-06-30 | Discharge: 2018-06-30 | Disposition: A | Discharge status: Home / Self Care | Payer: Managed Care, Other (non HMO) | Attending: Emergency Medicine | Admitting: Emergency Medicine

## 2018-06-30 DIAGNOSIS — R109 Unspecified abdominal pain: Secondary | ICD-10-CM | POA: Diagnosis not present

## 2018-06-30 DIAGNOSIS — O9989 Other specified diseases and conditions complicating pregnancy, childbirth and the puerperium: Secondary | ICD-10-CM | POA: Insufficient documentation

## 2018-06-30 DIAGNOSIS — Z87891 Personal history of nicotine dependence: Secondary | ICD-10-CM | POA: Diagnosis not present

## 2018-06-30 DIAGNOSIS — Z79899 Other long term (current) drug therapy: Secondary | ICD-10-CM | POA: Diagnosis not present

## 2018-06-30 DIAGNOSIS — Z3A37 37 weeks gestation of pregnancy: Secondary | ICD-10-CM | POA: Diagnosis not present

## 2018-06-30 HISTORY — DX: Encounter for supervision of normal pregnancy, unspecified, unspecified trimester: Z34.90

## 2018-06-30 MED ORDER — ACETAMINOPHEN 325 MG PO TABS
650.0000 mg | ORAL_TABLET | Freq: Once | ORAL | Status: AC
  Administered 2018-06-30: 650 mg via ORAL
  Filled 2018-06-30: qty 2

## 2018-06-30 MED ORDER — SODIUM CHLORIDE 0.9 % IV BOLUS
1000.0000 mL | Freq: Once | INTRAVENOUS | Status: AC
Start: 2018-06-30 — End: 2018-06-30
  Administered 2018-06-30: 1000 mL via INTRAVENOUS

## 2018-06-30 NOTE — ED Provider Notes (Signed)
Care assumed from Dr. Anitra LauthPlunkett.    At time of transfer care, patient is awaiting callback from her home OB/GYN team.  Dr. Anitra LauthPlunkett [facility] who recommended we touch base with the personal OB/GYN team.  Patient came in for abdominal sharp pain as she is 37.[redacted] weeks pregnant.  Patient continues to deny any vaginal bleeding, vaginal discharge, gush of fluids.  She reports baby is actively moving.  Patient has had decrease in her abdominal pains in the emergency department and patient's fetal heart rate was normal.  After several re-pages, the patient's home OB/GYN team has not called back.  Patient does not want to be transferred [facility] for a [facility] evaluation at this time.  After several hours of demonstrated stability and improving symptoms, patient did not want to wait longer for the home OB/GYN to call.  Patient has a plan to stay with someone over near [facility] team.  She also will call them tomorrow.  She will see them on Monday or sooner if needed.   I spoke with the rapid response nurse who reported patient has continued to have contractions on the monitor.  She recommended that if the patient had any abdominal pain or symptoms, she should go directly to the labor and delivery area at [facility] which patient understands.  At this time I do not feel patient is an active labor and she continues to have no further symptoms.   Patient understands the risks of developing active labor.  Patient had no other questions or concerns and will be discharged for continued outpatient management by her OB/GYN team.    Clinical Impression: 1. Abdominal pain, unspecified abdominal location   2. [redacted] weeks gestation of pregnancy     Disposition: Discharge  Condition: Good  I have discussed the results, Dx and Tx plan with the pt(& family if present). He/she/they expressed understanding and agree(s) with the plan. Discharge  instructions discussed at great length. Strict return precautions discussed and pt &/or family have verbalized understanding of the instructions. No further questions at time of discharge.    New Prescriptions   No medications on file    Follow Up: [facility] [address] [id number] mc [address] [place] [id number] [phone]        Elnathan Fulford, Canary Brimhristopher J, MD 07/01/18 [phone]

## 2018-06-30 NOTE — ED Notes (Addendum)
Contacted Rebecca Bradley with OB rapid response. Pt on Toco at this time. Rapid response monitoring remotely.

## 2018-06-30 NOTE — Progress Notes (Signed)
Spoke with Denice BorsAdam Brown RN. Dr. Vergie LivingPickens, our [facility] attending, wants Dr. Anitra LauthPlunkett to talk with the pt's OB about the pt's plan of care. Dr. Anitra LauthPlunkett can talk with Dr. Vergie LivingPickens at [phone] for questions or concerns.

## 2018-06-30 NOTE — Progress Notes (Signed)
Spoke with Denice Borsadam Brown RN. They are still waiting for Dr. Tawni Levyorn's office to call them back. Says pt is being induced next week so that Dr. Shawnie Ponsorn can deliver her baby.

## 2018-06-30 NOTE — ED Provider Notes (Signed)
[facility] EMERGENCY DEPARTMENT Provider Note   CSN: [id number] Arrival date & time: 06/30/18  1352     History   Chief Complaint Chief Complaint  Patient presents with  . Possible Labor    HPI ANAHY ESH is a 37 y.o. female.  Pt is a G3P1101 who is currently 37 weeks 6 days pregnant presenting today with more painful contractions that have been present all day and concerned that she might be in labor.  Patient has had no complications with this pregnancy and she has been well throughout.  She denies any vaginal discharge, burning urination or fever today.  She is felt baby move regularly.  She has had several soft bowel movements today but no diarrhea.  Patient is scheduled for induction next week with Dr. Shawnie Pons just because she lives in [place] and they wanted to make sure she could make it to the hospital but no other recent for induction.  Patient was at work and states symptoms were worse but she does sit at work and does not feel that she has gotten overheated or would be dehydrated.  She has not taken anything for the pain.  She states typically she is feeling contractions that feel like Braxton Hicks contractions for 5 times per hour.  However they been more regular over the last few hours and she is feeling them every for 5 minutes.  They are not becoming any stronger but have been persistent.  The history is provided by the patient.    Past Medical History:  Diagnosis Date  . BV (bacterial vaginosis)   . Medical history non-contributory   . Pregnant     There are no active problems to display for this patient.   Past Surgical History:  Procedure Laterality Date  . NO PAST SURGERIES       OB History    Gravida  3   Para  1   Term  1   Preterm      AB  1   Living  1     SAB  1   TAB      Ectopic      Multiple      Live Births  1            Home Medications    Prior to Admission medications   Medication Sig Start Date End  Date Taking? Authorizing Provider  metroNIDAZOLE (METROGEL VAGINAL) 0.75 % vaginal gel Apply 5g to vagina at bedtime for 5 days 06/17/18   Calvert Cantor, CNM  ondansetron (ZOFRAN ODT) 4 MG disintegrating tablet Take 1 tablet (4 mg total) by mouth every 8 (eight) hours as needed for nausea or vomiting. 02/28/18   Rasch, Harolyn Rutherford, NP  Prenatal Vit-Fe Fumarate-FA (PRENATAL COMPLETE) 14-0.4 MG TABS Take 1 tablet by mouth daily. 11/25/17   Molpus, John, MD  ranitidine (ZANTAC) 150 MG tablet Take 1 tablet (150 mg total) by mouth 2 (two) times daily. 01/01/18   Rasch, Harolyn Rutherford, NP    Family History No family history on file.  Social History Social History   Tobacco Use  . Smoking status: Former Smoker    Packs/day: 0.00    Last attempt to quit: 12/12/2017    Years since quitting: 0.5  . Smokeless tobacco: Never Used  Substance Use Topics  . Alcohol use: No    Frequency: Never  . Drug use: No     Allergies   Patient has no known allergies.  Review of Systems Review of Systems  All other systems reviewed and are negative.    Physical Exam Updated Vital Signs BP 133/89 (BP Location: Left Arm)   Pulse 93   Temp 98.3 F (36.8 C) (Oral)   Resp 20   Ht 5\' 5"  (1.651 m)   Wt 85.3 kg (188 lb)   LMP 10/17/2017 (Exact Date)   SpO2 100%   BMI 31.28 kg/m   Physical Exam  Constitutional: She is oriented to person, place, and time. She appears well-developed and well-nourished. No distress.  HENT:  Head: Normocephalic and atraumatic.  Mouth/Throat: Oropharynx is clear and moist.  Eyes: Pupils are equal, round, and reactive to light. Conjunctivae and EOM are normal.  Neck: Normal range of motion. Neck supple.  Cardiovascular: Normal rate, regular rhythm and intact distal pulses.  No murmur heard. Pulmonary/Chest: Effort normal and breath sounds normal. No respiratory distress. She has no wheezes. She has no rales.  Abdominal: Soft. She exhibits no distension. There is no  tenderness. There is no rebound and no guarding.  Gravid abdomen up to the xiphoid process.  No tenderness with abdominal palpation  Genitourinary:  Genitourinary Comments: No vaginal bleeding present.  Cervix is thick and 1 cm.  Baby's head is palpated  Musculoskeletal: Normal range of motion. She exhibits no edema or tenderness.  Neurological: She is alert and oriented to person, place, and time.  Skin: Skin is warm and dry. No rash noted. No erythema.  Psychiatric: She has a normal mood and affect. Her behavior is normal.  Nursing note and vitals reviewed.    ED Treatments / Results  Labs (all labs ordered are listed, but only abnormal results are displayed) Labs Reviewed - No data to display  EKG None  Radiology No results found.  Procedures Procedures (including critical care time)  Medications Ordered in ED Medications  sodium chloride 0.9 % bolus 1,000 mL (0 mLs Intravenous Stopped 06/30/18 1508)  acetaminophen (TYLENOL) tablet 650 mg (650 mg Oral Given 06/30/18 1507)     Initial Impression / Assessment and Plan / ED Course  I have reviewed the triage vital signs and the nursing notes.  Pertinent labs & imaging results that were available during my care of the patient were reviewed by me and considered in my medical decision making (see chart for details).     Healthy 37 year old female who is currently 26 and [redacted] weeks pregnant presenting today with Braxton Hicks contractions and intermittent sharp pains and she is concerned she may be in labor.  She denies any infectious symptoms or vaginal bleeding today.  She has not had new discharge.  She is felt baby move throughout the day.  Is had no complications thus far with this pregnancy.  She is set for induction next week at 39 weeks with Dr. Shawnie Pons.  This is only because she does not live in [place] and she wanted to make sure she was able to be at the hospital for delivery.  She was at work today and felt that the  symptoms were worsening.  Patient has been eating and drinking normally and does not think that she is dehydrated.  Patient's vital signs are reassuring.  Toco shows intermittent sporadic small contractions.  Baby's heart rate is between 120 and 150 and reassuring.  On sterile pelvic exam patient's cervix is thick and 1 cm.  She has no tenderness during pelvic exam.  There is no blood noted.  Patient is feeling better after IV  fluids and Tylenol.  Will discuss with [facility]GYN at [facility]  Final Clinical Impressions(s) / ED Diagnoses   Final diagnoses:  None    ED Discharge Orders    None       Gwyneth SproutPlunkett, Aleynah Rocchio, MD 07/02/18 2008

## 2018-06-30 NOTE — ED Notes (Signed)
2nd page to Dr Tawni Levyorn's office @ [phone] via answering service.

## 2018-06-30 NOTE — Progress Notes (Signed)
Spoke with Denice BorsAdam Brown RN. Says Dr. Anitra LauthPlunkett has checked the pt's cervix and it is closed. The pt's contractions have spaced out, the FHR tracing is a category 1, and the pt will be d/c home.

## 2018-06-30 NOTE — Progress Notes (Signed)
Spoke with Denice BorsAdam Brown RN. Pt is contracting every 2-4 min. They are starting IVF on  The pt and I have asked that the provider check the pt's cervix. Also they should contact the pt's provider in [facility] to determine a plan of care. Dr. Vergie LivingPickens, Faculty Practice attending notified of pt. Unable to give him report because he has to check on a pt in our triage area. I will call him back.

## 2018-06-30 NOTE — ED Triage Notes (Signed)
Pt is pregnant with due date August 5. She is scheduled for induction next week. With Dr. Shawnie Ponsorn. States she has been having intermittent contractions and wants to be checked. States had an anxiety attack this am which made her Sx worse. Reports increased BM frequency today

## 2018-06-30 NOTE — ED Notes (Signed)
Requested on-call physician from Dr. Tawni Levyorn's office. Dr. Norton BlizzardShannon Mattern to be paged.

## 2018-06-30 NOTE — Progress Notes (Signed)
Spoke with Dr. Vergie LivingPickens. Pt is a G3P1 at 36 4/[redacted] weeks gestation presenting with c/o uc's. Pt was initially contracting every 2-4 min, but after a liter of IVF her uc's have spaced out. Dr. Anitra LauthPlunkett, the ED MD has checked the pt's cervix and says that it is closed. FHR tracing is a category 1 and the pt is going to be dc'd home. Dr. Vergie LivingPickens wants dr. Anitra LauthPlunkett to call the pt's OB and talk to him about the plan of care.

## 2018-06-30 NOTE — Progress Notes (Signed)
Received call from [facility] RN, Denice Borsdam Brown. Pt is a G3P1 at 36 4/[redacted] weeks gestation presenting with c/o UC's. Says pt is not having vaginal bleeding or leaking of fluid. Pt has had a previous vaginal delivery and gets her care with [facility]. [facility].

## 2018-06-30 NOTE — Discharge Instructions (Addendum)
Please follow-up with your OB/GYN team and call them tomorrow.  If you develop any new or worsened symptoms, please return to the nearest emergency department.

## 2018-06-30 NOTE — Progress Notes (Signed)
Spoke with Dr. Christa Seegeler. Says pt is not feeling her contractions, no vaginal bleeding or leaking of fluid. I told him that her uc's have increased in frequency every 1-4 min, 2-6 min. Says pt is going to be d/c home. Pt has told them that she is staying close to [facility] and will go there to their labor and delivery unit if she starts feeling her contractions again. Dr. Christa Seegeler says he feels comfortable with Dr. Loralee PacasPlunkett;s vaginal exam. I told him that Dr. Vergie LivingPickens had suggested that they talk with the pt's OB to see if he was comfortable with the plan of care. Dr. Purcell Nailsgler said that they had paged the on call provider, but never received a call back. Pt says she does not want to be transferred to any facility at this time.FHR tracing baseline is a category 1.

## 2018-10-16 ENCOUNTER — Encounter (HOSPITAL_COMMUNITY): Payer: Self-pay

## 2019-07-26 ENCOUNTER — Other Ambulatory Visit: Payer: Self-pay

## 2019-07-26 ENCOUNTER — Emergency Department (HOSPITAL_BASED_OUTPATIENT_CLINIC_OR_DEPARTMENT_OTHER)
Admission: EM | Admit: 2019-07-26 | Discharge: 2019-07-27 | Disposition: A | Discharge status: Home / Self Care | Payer: Managed Care, Other (non HMO) | Attending: Emergency Medicine | Admitting: Emergency Medicine

## 2019-07-26 ENCOUNTER — Encounter (HOSPITAL_BASED_OUTPATIENT_CLINIC_OR_DEPARTMENT_OTHER): Payer: Self-pay

## 2019-07-26 DIAGNOSIS — R0981 Nasal congestion: Secondary | ICD-10-CM | POA: Insufficient documentation

## 2019-07-26 DIAGNOSIS — R21 Rash and other nonspecific skin eruption: Secondary | ICD-10-CM

## 2019-07-26 DIAGNOSIS — F17210 Nicotine dependence, cigarettes, uncomplicated: Secondary | ICD-10-CM | POA: Insufficient documentation

## 2019-07-26 DIAGNOSIS — M542 Cervicalgia: Secondary | ICD-10-CM | POA: Insufficient documentation

## 2019-07-26 DIAGNOSIS — Z20828 Contact with and (suspected) exposure to other viral communicable diseases: Secondary | ICD-10-CM | POA: Insufficient documentation

## 2019-07-26 DIAGNOSIS — Z20822 Contact with and (suspected) exposure to covid-19: Secondary | ICD-10-CM

## 2019-07-26 HISTORY — DX: Anxiety disorder, unspecified: F41.9

## 2019-07-26 LAB — URINALYSIS, ROUTINE W REFLEX MICROSCOPIC
Bilirubin Urine: NEGATIVE
Glucose, UA: NEGATIVE mg/dL
Hgb urine dipstick: NEGATIVE
Ketones, ur: NEGATIVE mg/dL
Nitrite: NEGATIVE
Protein, ur: NEGATIVE mg/dL
Specific Gravity, Urine: 1.02 (ref 1.005–1.030)
pH: 6.5 (ref 5.0–8.0)

## 2019-07-26 LAB — URINALYSIS, MICROSCOPIC (REFLEX): Bacteria, UA: NONE SEEN

## 2019-07-26 LAB — PREGNANCY, URINE: Preg Test, Ur: NEGATIVE

## 2019-07-26 MED ORDER — CYCLOBENZAPRINE HCL 10 MG PO TABS: 10.0000 mg | ORAL_TABLET | Freq: Three times a day (TID) | ORAL | 0 refills | Status: DC | PRN

## 2019-07-26 MED ORDER — HYDROCORTISONE 1 % EX CREA
TOPICAL_CREAM | Freq: Two times a day (BID) | CUTANEOUS | Status: DC
  Administered 2019-07-26: 1 via TOPICAL
  Filled 2019-07-26: qty 28

## 2019-07-26 NOTE — ED Provider Notes (Signed)
[facility] Provider Note: Lowella DellJ. Lane Cherisse Carrell, MD, FACEP  CSN: [id number] MRN: [id number] ARRIVAL: 07/26/19 at 2227 ROOM: MH04/[facility]04   CHIEF COMPLAINT  Rash   HISTORY OF PRESENT ILLNESS  07/26/19 11:18 PM Rebecca Bradley is a 38 y.o. female with a history of anxiety.  She is here after noticing a rash on her upper arm several hours ago.  The rash is mildly pruritic.  It is papular in nature.  She does not know what may have triggered it.  It is not severe.  There are no associated systemic symptoms such as throat swelling, shortness of breath, nausea, vomiting or diarrhea.  She has had several days of tightness in her neck muscles and has had nasal congestion.    Past Medical History:  Diagnosis Date  . Anxiety   . BV (bacterial vaginosis)   . Medical history non-contributory   . Pregnant     Past Surgical History:  Procedure Laterality Date  . NO PAST SURGERIES      No family history on file.  Social History   Tobacco Use  . Smoking status: Current Every Day Smoker    Packs/day: 0.00  . Smokeless tobacco: Never Used  Substance Use Topics  . Alcohol use: No    Frequency: Never  . Drug use: No    Prior to Admission medications   Medication Sig Start Date End Date Taking? Authorizing Provider  cyclobenzaprine (FLEXERIL) 10 MG tablet Take 1 tablet (10 mg total) by mouth 3 (three) times daily as needed for muscle spasms. 07/26/19   Sidnee Gambrill, MD  ranitidine (ZANTAC) 150 MG tablet Take 1 tablet (150 mg total) by mouth 2 (two) times daily. 01/01/18 07/26/19  Rasch, Harolyn RutherfordJennifer I, NP    Allergies Patient has no known allergies.   REVIEW OF SYSTEMS  Negative except as noted here or in the History of Present Illness.   PHYSICAL EXAMINATION  Initial Vital Signs Blood pressure (!) 123/98, pulse 86, temperature 99.6 F (37.6 C), temperature source Oral, resp. rate 18, last menstrual period 06/27/2019, SpO2 100 %, unknown if currently breastfeeding.  Examination  General: Well-developed, well-nourished female in no acute distress; appearance consistent with age of record HENT: normocephalic; atraumatic Eyes: pupils equal, round and reactive to light; extraocular muscles intact Neck: supple; mild tenderness of posterior musculature including trapezius bilaterally Heart: regular rate and rhythm; no murmurs, rubs or gallops Lungs: clear to auscultation bilaterally Abdomen: soft; nondistended; nontender; no masses or hepatosplenomegaly; bowel sounds present Extremities: No deformity; full range of motion; pulses normal Neurologic: Awake, alert and oriented; motor function intact in all extremities and symmetric; no facial droop Skin: Warm and dry; fine papular rash of upper arms without erythema Psychiatric: Normal mood and affect   RESULTS  Summary of this visit's results, reviewed by myself:   EKG Interpretation  Date/Time:    Ventricular Rate:    PR Interval:    QRS Duration:   QT Interval:    QTC Calculation:   R Axis:     Text Interpretation:        Laboratory Studies: Results for orders placed or performed during the hospital encounter of 07/26/19 (from the past 24 hour(s))  Pregnancy, urine     Status: None   Collection Time: 07/26/19 11:24 PM  Result Value Ref Range   Preg Test, Ur NEGATIVE NEGATIVE  Urinalysis, Routine w reflex microscopic     Status: Abnormal   Collection Time: 07/26/19 11:24 PM  Result Value Ref  Range   Color, Urine YELLOW YELLOW   APPearance CLEAR CLEAR   Specific Gravity, Urine 1.020 1.005 - 1.030   pH 6.5 5.0 - 8.0   Glucose, UA NEGATIVE NEGATIVE mg/dL   Hgb urine dipstick NEGATIVE NEGATIVE   Bilirubin Urine NEGATIVE NEGATIVE   Ketones, ur NEGATIVE NEGATIVE mg/dL   Protein, ur NEGATIVE NEGATIVE mg/dL   Nitrite NEGATIVE NEGATIVE   Leukocytes,Ua TRACE (A) NEGATIVE  Urinalysis, Microscopic (reflex)     Status: None   Collection Time: 07/26/19 11:24 PM  Result Value Ref Range   RBC / HPF 0-5 0 - 5  RBC/hpf   WBC, UA 0-5 0 - 5 WBC/hpf   Bacteria, UA NONE SEEN NONE SEEN   Squamous Epithelial / LPF 0-5 0 - 5   Imaging Studies: No results found.  ED COURSE and MDM  Nursing notes and initial vitals signs, including pulse oximetry, reviewed.  Vitals:   07/26/19 2236  BP: (!) 123/98  Pulse: 86  Resp: 18  Temp: 99.6 F (37.6 C)  TempSrc: Oral  SpO2: 100%   Rebecca Bradley was evaluated in Emergency Department on 07/26/2019 for the symptoms described in the history of present illness. She was evaluated in the context of the global COVID-19 pandemic, which necessitated consideration that the patient might be at risk for infection with the SARS-CoV-2 virus that causes COVID-19. Institutional protocols and algorithms that pertain to the evaluation of patients at risk for COVID-19 are in a state of rapid change based on information released by regulatory bodies including the CDC and federal and state organizations. These policies and algorithms were followed during the patient's care in the ED.  We will treat patient's rash with topical hydrocortisone cream and muscle aches with Flexeril.  Given low-grade fever, nasal congestion and muscle aches COVID-19 test is pending.  PROCEDURES    ED DIAGNOSES     ICD-10-CM   1. Rash  R21   2. Suspected Covid-19 Virus Infection  R68.89        Shanon Rosser, MD 07/26/19 2353

## 2019-07-26 NOTE — ED Triage Notes (Signed)
C/o rash to bilat UE x today-NAD-steady gait

## 2019-07-27 LAB — SARS CORONAVIRUS 2 (TAT 6-24 HRS): SARS Coronavirus 2: NEGATIVE

## 2019-09-05 ENCOUNTER — Emergency Department (HOSPITAL_BASED_OUTPATIENT_CLINIC_OR_DEPARTMENT_OTHER): Payer: Self-pay

## 2019-09-05 ENCOUNTER — Emergency Department (HOSPITAL_BASED_OUTPATIENT_CLINIC_OR_DEPARTMENT_OTHER)
Admission: EM | Admit: 2019-09-05 | Discharge: 2019-09-06 | Disposition: A | Discharge status: Home / Self Care | Payer: Self-pay | Attending: Emergency Medicine | Admitting: Emergency Medicine

## 2019-09-05 ENCOUNTER — Encounter (HOSPITAL_BASED_OUTPATIENT_CLINIC_OR_DEPARTMENT_OTHER): Payer: Self-pay

## 2019-09-05 ENCOUNTER — Other Ambulatory Visit: Payer: Self-pay

## 2019-09-05 DIAGNOSIS — F17210 Nicotine dependence, cigarettes, uncomplicated: Secondary | ICD-10-CM | POA: Insufficient documentation

## 2019-09-05 DIAGNOSIS — R072 Precordial pain: Secondary | ICD-10-CM | POA: Insufficient documentation

## 2019-09-05 DIAGNOSIS — N760 Acute vaginitis: Secondary | ICD-10-CM | POA: Insufficient documentation

## 2019-09-05 LAB — CBC
HCT: 38.7 % (ref 36.0–46.0)
Hemoglobin: 12.9 g/dL (ref 12.0–15.0)
MCH: 30.4 pg (ref 26.0–34.0)
MCHC: 33.3 g/dL (ref 30.0–36.0)
MCV: 91.3 fL (ref 80.0–100.0)
Platelets: 244 10*3/uL (ref 150–400)
RBC: 4.24 MIL/uL (ref 3.87–5.11)
RDW: 11.9 % (ref 11.5–15.5)
WBC: 5.7 10*3/uL (ref 4.0–10.5)
nRBC: 0 % (ref 0.0–0.2)

## 2019-09-05 LAB — PREGNANCY, URINE: Preg Test, Ur: NEGATIVE

## 2019-09-05 LAB — URINALYSIS, ROUTINE W REFLEX MICROSCOPIC
Bilirubin Urine: NEGATIVE
Glucose, UA: NEGATIVE mg/dL
Hgb urine dipstick: NEGATIVE
Ketones, ur: NEGATIVE mg/dL
Leukocytes,Ua: NEGATIVE
Nitrite: NEGATIVE
Protein, ur: NEGATIVE mg/dL
Specific Gravity, Urine: 1.015 (ref 1.005–1.030)
pH: 7 (ref 5.0–8.0)

## 2019-09-05 LAB — BASIC METABOLIC PANEL
Anion gap: 10 (ref 5–15)
BUN: 17 mg/dL (ref 6–20)
CO2: 24 mmol/L (ref 22–32)
Calcium: 9.7 mg/dL (ref 8.9–10.3)
Chloride: 105 mmol/L (ref 98–111)
Creatinine, Ser: 0.88 mg/dL (ref 0.44–1.00)
GFR calc Af Amer: >60 mL/min (ref >60)
GFR calc non Af Amer: >60 mL/min (ref >60)
Glucose, Bld: 87 mg/dL (ref 70–99)
Potassium: 4 mmol/L (ref 3.5–5.1)
Sodium: 139 mmol/L (ref 135–145)

## 2019-09-05 LAB — TROPONIN I (HIGH SENSITIVITY): Troponin I (High Sensitivity): <2 ng/L (ref <18)

## 2019-09-05 MED ORDER — SODIUM CHLORIDE 0.9% FLUSH
3.0000 mL | Freq: Once | INTRAVENOUS | Status: DC
  Filled 2019-09-05: qty 3

## 2019-09-05 NOTE — ED Triage Notes (Signed)
C/o CP/mid back pain started today-also c/o abd pain and vaginal d/c x 1 week-NAD-steady gait

## 2019-09-06 LAB — D-DIMER, QUANTITATIVE: D-Dimer, Quant: <0.27 ug/mL-FEU (ref 0.00–0.50)

## 2019-09-06 MED ORDER — METRONIDAZOLE 500 MG PO TABS: 500.0000 mg | ORAL_TABLET | Freq: Two times a day (BID) | ORAL | 0 refills | Status: DC

## 2019-09-06 MED ORDER — IBUPROFEN 400 MG PO TABS
400.0000 mg | ORAL_TABLET | Freq: Once | ORAL | Status: AC
  Administered 2019-09-06: 01:00:00 400 mg via ORAL
  Filled 2019-09-06: qty 1

## 2019-09-06 NOTE — Discharge Instructions (Addendum)

## 2019-09-06 NOTE — ED Provider Notes (Signed)
[facility] EMERGENCY DEPARTMENT Provider Note   CSN: [id number] Arrival date & time: 09/05/19  2221     History   Chief Complaint Chief Complaint  Patient presents with  . Chest Pain    HPI Rebecca Bradley is a 38 y.o. female.     The history is provided by the patient.  Chest Pain Pain location:  R chest Pain quality: sharp   Pain radiates to:  Does not radiate Pain severity:  Moderate Onset quality:  Sudden Duration:  1 day Timing:  Intermittent Progression:  Unchanged Chronicity:  New Context: breathing   Relieved by:  Nothing Worsened by:  Deep breathing Associated symptoms: abdominal pain and shortness of breath   Associated symptoms: no cough, no fever, no lower extremity edema, no syncope and no vomiting   Risk factors: birth control and smoking   Risk factors: no coronary artery disease and no prior DVT/PE   Risk factors comment:  +IUD use Patient reports for the past day she has had chest pain.  She reports it for started as right upper back pain.  She then began to have right upper chest pain.  Is worse with breathing.  She reports mild shortness of breath.  No hemoptysis.  No fevers. She is a smoker, but she just quit smoking   She also reports recent abdominal pain and vaginal discharge. Past Medical History:  Diagnosis Date  . Anxiety   . BV (bacterial vaginosis)   . Medical history non-contributory   . Pregnant     There are no active problems to display for this patient.   Past Surgical History:  Procedure Laterality Date  . NO PAST SURGERIES       OB History    Gravida  3   Para  1   Term  1   Preterm      AB  1   Living  1     SAB  1   TAB      Ectopic      Multiple      Live Births  1            Home Medications    Prior to Admission medications   Medication Sig Start Date End Date Taking? Authorizing Provider  metroNIDAZOLE (FLAGYL) 500 MG tablet Take 1 tablet (500 mg total) by mouth 2 (two)  times daily. One po bid x 7 days 09/06/19   Zadie RhineWickline, Cloys Vera, MD  ranitidine (ZANTAC) 150 MG tablet Take 1 tablet (150 mg total) by mouth 2 (two) times daily. 01/01/18 07/26/19  Rasch, Harolyn RutherfordJennifer I, NP    Family History No family history on file.  Social History Social History   Tobacco Use  . Smoking status: Current Every Day Smoker    Packs/day: 0.00  . Smokeless tobacco: Never Used  Substance Use Topics  . Alcohol use: No    Frequency: Never  . Drug use: No     Allergies   Patient has no known allergies.   Review of Systems Review of Systems  Constitutional: Negative for fever.  Respiratory: Positive for shortness of breath. Negative for cough.   Cardiovascular: Positive for chest pain. Negative for leg swelling and syncope.  Gastrointestinal: Positive for abdominal pain. Negative for vomiting.  Genitourinary: Positive for vaginal discharge. Negative for vaginal bleeding.  Neurological: Negative for syncope.  All other systems reviewed and are negative.    Physical Exam Updated Vital Signs BP 131/87   Pulse (!) 58  Temp 99.5 F (37.5 C) (Oral)   Resp 20   Ht 1.651 m (5\' 5" )   Wt 72.6 kg   SpO2 100%   BMI 26.63 kg/m   Physical Exam CONSTITUTIONAL: Well developed/well nourished HEAD: Normocephalic/atraumatic EYES: EOMI/PERRL ENMT: Mucous membranes moist NECK: supple no meningeal signs SPINE/BACK:entire spine nontender CV: S1/S2 noted, no murmurs/rubs/gallops noted LUNGS: Lungs are clear to auscultation bilaterally, no apparent distress ABDOMEN: soft, nontender, no rebound or guarding, bowel sounds noted throughout abdomen GU:no cva tenderness NEURO: Pt is awake/alert/appropriate, moves all extremitiesx4.  No facial droop.   EXTREMITIES: pulses normal/equal, full ROM no lower extremity edema SKIN: warm, color normal PSYCH: no abnormalities of mood noted, alert and oriented to situation   ED Treatments / Results  Labs (all labs ordered are listed, but  only abnormal results are displayed) Labs Reviewed  URINALYSIS, ROUTINE W REFLEX MICROSCOPIC - Abnormal; Notable for the following components:      Result Value   APPearance CLOUDY (*)    All other components within normal limits  BASIC METABOLIC PANEL  CBC  PREGNANCY, URINE  D-DIMER, QUANTITATIVE (NOT AT [facility])  TROPONIN I (HIGH SENSITIVITY)    EKG EKG Interpretation  Date/Time:  Thursday September 05 2019 22:31:31 EDT Ventricular Rate:  88 PR Interval:  172 QRS Duration: 84 QT Interval:  368 QTC Calculation: 445 R Axis:   -12 Text Interpretation:  Normal sinus rhythm Anteroseptal infarct , age undetermined Abnormal ECG No significant change since last tracing Confirmed by 08-10-1992 (Zadie Rhine) on 09/06/2019 12:23:05 AM   Radiology Dg Chest 2 View  Result Date: 09/05/2019 CLINICAL DATA:  38 year old female with chest pain. EXAM: CHEST - 2 VIEW COMPARISON:  Chest radiograph dated 10/20/2017 FINDINGS: The heart size and mediastinal contours are within normal limits. Both lungs are clear. The visualized skeletal structures are unremarkable. IMPRESSION: No active cardiopulmonary disease. Electronically Signed   By: 13/08/2017 M.D.   On: 09/05/2019 22:54    Procedures Procedures  Medications Ordered in ED Medications  sodium chloride flush (NS) 0.9 % injection 3 mL (0 mLs Intravenous Hold 09/05/19 2325)  ibuprofen (ADVIL) tablet 400 mg (400 mg Oral Given 09/06/19 0040)     Initial Impression / Assessment and Plan / ED Course  I have reviewed the triage vital signs and the nursing notes.  Pertinent labs & imaging results that were available during my care of the patient were reviewed by me and considered in my medical decision making (see chart for details).        Patient presents for right-sided chest pain, it is not reproducible on exam.  She is a smoker.  D-dimer sent and negative. Given her history and exam, low suspicion for ACS. She is low risk for any acute  cardiopulmonary emergency.  Discussed smoking cessation with patient and was they were offerred resources to help stop.  Total time was 5 min CPT code 09/08/19.   Patient also mentions abdominal pain and vaginal discharge.  She is not pregnant.  No focal abdominal tenderness.  After further discussion, I offered prescribing her Flagyl as she reports frequent bouts of BV.  She would like to have this prescription but no further testing Advised to avoid alcohol while taking this medicine  Final Clinical Impressions(s) / ED Diagnoses   Final diagnoses:  Precordial pain  Acute vaginitis    ED Discharge Orders         Ordered    metroNIDAZOLE (FLAGYL) 500 MG tablet  2 times  daily     09/06/19 0108           Ripley Fraise, MD 09/06/19 0139

## 2019-09-17 ENCOUNTER — Emergency Department (HOSPITAL_BASED_OUTPATIENT_CLINIC_OR_DEPARTMENT_OTHER)
Admission: EM | Admit: 2019-09-17 | Discharge: 2019-09-18 | Disposition: A | Discharge status: Home / Self Care | Payer: Medicaid Other | Attending: Emergency Medicine | Admitting: Emergency Medicine

## 2019-09-17 ENCOUNTER — Other Ambulatory Visit: Payer: Self-pay

## 2019-09-17 ENCOUNTER — Encounter (HOSPITAL_BASED_OUTPATIENT_CLINIC_OR_DEPARTMENT_OTHER): Payer: Self-pay | Admitting: *Deleted

## 2019-09-17 DIAGNOSIS — B9689 Other specified bacterial agents as the cause of diseases classified elsewhere: Secondary | ICD-10-CM

## 2019-09-17 DIAGNOSIS — F17210 Nicotine dependence, cigarettes, uncomplicated: Secondary | ICD-10-CM | POA: Insufficient documentation

## 2019-09-17 DIAGNOSIS — N760 Acute vaginitis: Secondary | ICD-10-CM | POA: Insufficient documentation

## 2019-09-17 LAB — URINALYSIS, ROUTINE W REFLEX MICROSCOPIC
Bilirubin Urine: NEGATIVE
Glucose, UA: NEGATIVE mg/dL
Hgb urine dipstick: NEGATIVE
Ketones, ur: NEGATIVE mg/dL
Leukocytes,Ua: NEGATIVE
Nitrite: NEGATIVE
Protein, ur: NEGATIVE mg/dL
Specific Gravity, Urine: 1.015 (ref 1.005–1.030)
pH: 8.5 — ABNORMAL HIGH (ref 5.0–8.0)

## 2019-09-17 LAB — PREGNANCY, URINE: Preg Test, Ur: NEGATIVE

## 2019-09-17 NOTE — ED Triage Notes (Signed)
Pt reports vaginal discharge. States that she was here 2 weeks ago for the same but was noncompliant with getting rx filled for treatment.

## 2019-09-18 LAB — HIV ANTIBODY (ROUTINE TESTING W REFLEX): HIV Screen 4th Generation wRfx: NONREACTIVE

## 2019-09-18 LAB — RPR: RPR Ser Ql: NONREACTIVE

## 2019-09-18 LAB — WET PREP, GENITAL
Sperm: NONE SEEN
Trich, Wet Prep: NONE SEEN
Yeast Wet Prep HPF POC: NONE SEEN

## 2019-09-18 MED ORDER — METRONIDAZOLE 500 MG PO TABS
500.0000 mg | ORAL_TABLET | Freq: Once | ORAL | Status: AC
  Administered 2019-09-18: 500 mg via ORAL
  Filled 2019-09-18: qty 1

## 2019-09-18 MED ORDER — METRONIDAZOLE 500 MG PO TABS: 500.0000 mg | ORAL_TABLET | Freq: Two times a day (BID) | ORAL | 0 refills | Status: DC

## 2019-09-18 NOTE — ED Provider Notes (Signed)
[facility] EMERGENCY DEPARTMENT Provider Note   CSN: [id number] Arrival date & time: 09/17/19  2209    History   Chief Complaint Chief Complaint  Patient presents with  . Abdominal Pain    HPI Rebecca Bradley is a 38 y.o. female.   The history is provided by the patient.  She has history of bacterial vaginosis and comes in complaining of suprapubic pain and vaginal discharge for the last 2-3 weeks.  Pain is worse when walking, better after taking ibuprofen.  She rates pain at 8/10.  She denies fever or chills.  She denies nausea or vomiting.  She has an IUD for contraception and does not have regular menses.  She states that she has not had sexual intercourse since the pain started.  She has had a white vaginal discharge during the same period and the discharge is malodorous.  She had been seen in the emergency department and was prescribed metronidazole, but never filled the prescription.  Past Medical History:  Diagnosis Date  . Anxiety   . BV (bacterial vaginosis)   . Medical history non-contributory   . Pregnant     There are no active problems to display for this patient.   Past Surgical History:  Procedure Laterality Date  . NO PAST SURGERIES       OB History    Gravida  3   Para  1   Term  1   Preterm      AB  1   Living  1     SAB  1   TAB      Ectopic      Multiple      Live Births  1            Home Medications    Prior to Admission medications   Medication Sig Start Date End Date Taking? Authorizing Provider  metroNIDAZOLE (FLAGYL) 500 MG tablet Take 1 tablet (500 mg total) by mouth 2 (two) times daily. One po bid x 7 days 09/06/19   Zadie Rhine, MD  ranitidine (ZANTAC) 150 MG tablet Take 1 tablet (150 mg total) by mouth 2 (two) times daily. 01/01/18 07/26/19  Rasch, Harolyn Rutherford, NP    Family History History reviewed. No pertinent family history.  Social History Social History   Tobacco Use  . Smoking status:  Current Every Day Smoker    Packs/day: 0.00  . Smokeless tobacco: Never Used  Substance Use Topics  . Alcohol use: No    Frequency: Never  . Drug use: No     Allergies   Patient has no known allergies.   Review of Systems Review of Systems  All other systems reviewed and are negative.    Physical Exam Updated Vital Signs BP 122/90 (BP Location: Left Arm)   Pulse 88   Temp 98.3 F (36.8 C) (Oral)   Resp 16   Ht 5\' 5"  (1.651 m)   Wt 72.6 kg   SpO2 99%   BMI 26.63 kg/m   Physical Exam Vitals signs and nursing note reviewed.    38 year old female, resting comfortably and in no acute distress. Vital signs are normal. Oxygen saturation is 99%, which is normal. Head is normocephalic and atraumatic. PERRLA, EOMI. Oropharynx is clear. Neck is nontender and supple without adenopathy or JVD. Back is nontender and there is no CVA tenderness. Lungs are clear without rales, wheezes, or rhonchi. Chest is nontender. Heart has regular rate and rhythm without murmur.  Abdomen is soft, flat, with mild suprapubic tenderness.  There is no rebound or guarding.  There are no masses or hepatosplenomegaly and peristalsis is normoactive. Pelvic: Normal external female genitalia.  Cervix is closed with IUD string in place.  Small amount of cloudy discharge present.  There is no cervical motion tenderness..  No adnexal masses or tenderness.  Fundus is normal size and position. Extremities have no cyanosis or edema, full range of motion is present. Skin is warm and dry without rash. Neurologic: Mental status is normal, cranial nerves are intact, there are no motor or sensory deficits.  ED Treatments / Results  Labs (all labs ordered are listed, but only abnormal results are displayed) Labs Reviewed  WET PREP, GENITAL - Abnormal; Notable for the following components:      Result Value   Clue Cells Wet Prep HPF POC PRESENT (*)    WBC, Wet Prep HPF POC MANY (*)    All other components within  normal limits  URINALYSIS, ROUTINE W REFLEX MICROSCOPIC - Abnormal; Notable for the following components:   APPearance CLOUDY (*)    pH 8.5 (*)    All other components within normal limits  PREGNANCY, URINE  RPR  HIV ANTIBODY (ROUTINE TESTING W REFLEX)  HIV4GL SAVE TUBE  GC/CHLAMYDIA PROBE AMP ([place]) NOT AT [facility]   Procedures Procedures   Medications Ordered in ED Medications  metroNIDAZOLE (FLAGYL) tablet 500 mg (has no administration in time range)     Initial Impression / Assessment and Plan / ED Course  I have reviewed the triage vital signs and the nursing notes.  Pertinent lab results that were available during my care of the patient were reviewed by me and considered in my medical decision making (see chart for details).  Belviq pain and discharge in patient with IUD concerning for pelvic inflammatory disease.  Old records reviewed confirming prior ED visits for bacterial vaginosis.  Specimens are sent for STI evaluation.  Urinalysis is unremarkable.  Pelvic exam is not consistent with PID.  Wet prep does show presence of clue cells.  She is discharged with prescription for metronidazole and is referred back to her gynecologist for follow-up.  Final Clinical Impressions(s) / ED Diagnoses   Final diagnoses:  Bacterial vaginosis    ED Discharge Orders         Ordered    metroNIDAZOLE (FLAGYL) 500 MG tablet  2 times daily     09/18/19 1610           Delora Fuel, MD 96/04/54 0205

## 2019-09-20 LAB — GC/CHLAMYDIA PROBE AMP (~~LOC~~) NOT AT ARMC
Chlamydia: NEGATIVE
Neisseria Gonorrhea: NEGATIVE

## 2019-11-29 ENCOUNTER — Other Ambulatory Visit: Payer: Medicaid Other

## 2020-05-12 ENCOUNTER — Encounter (HOSPITAL_BASED_OUTPATIENT_CLINIC_OR_DEPARTMENT_OTHER): Payer: Self-pay | Admitting: *Deleted

## 2020-05-12 ENCOUNTER — Emergency Department (HOSPITAL_BASED_OUTPATIENT_CLINIC_OR_DEPARTMENT_OTHER): Payer: Self-pay

## 2020-05-12 ENCOUNTER — Other Ambulatory Visit: Payer: Self-pay

## 2020-05-12 ENCOUNTER — Emergency Department (HOSPITAL_BASED_OUTPATIENT_CLINIC_OR_DEPARTMENT_OTHER)
Admission: EM | Admit: 2020-05-12 | Discharge: 2020-05-12 | Disposition: A | Discharge status: Home / Self Care | Payer: Self-pay | Attending: Emergency Medicine | Admitting: Emergency Medicine

## 2020-05-12 DIAGNOSIS — K8020 Calculus of gallbladder without cholecystitis without obstruction: Secondary | ICD-10-CM | POA: Insufficient documentation

## 2020-05-12 DIAGNOSIS — B373 Candidiasis of vulva and vagina: Secondary | ICD-10-CM | POA: Insufficient documentation

## 2020-05-12 DIAGNOSIS — B3731 Acute candidiasis of vulva and vagina: Secondary | ICD-10-CM

## 2020-05-12 DIAGNOSIS — F17210 Nicotine dependence, cigarettes, uncomplicated: Secondary | ICD-10-CM | POA: Insufficient documentation

## 2020-05-12 DIAGNOSIS — R1011 Right upper quadrant pain: Secondary | ICD-10-CM

## 2020-05-12 LAB — COMPREHENSIVE METABOLIC PANEL
ALT: 13 U/L (ref 0–44)
AST: 17 U/L (ref 15–41)
Albumin: 4.6 g/dL (ref 3.5–5.0)
Alkaline Phosphatase: 55 U/L (ref 38–126)
Anion gap: 10 (ref 5–15)
BUN: 12 mg/dL (ref 6–20)
CO2: 25 mmol/L (ref 22–32)
Calcium: 9.4 mg/dL (ref 8.9–10.3)
Chloride: 103 mmol/L (ref 98–111)
Creatinine, Ser: 0.78 mg/dL (ref 0.44–1.00)
GFR calc Af Amer: >60 mL/min (ref >60)
GFR calc non Af Amer: >60 mL/min (ref >60)
Glucose, Bld: 97 mg/dL (ref 70–99)
Potassium: 3.8 mmol/L (ref 3.5–5.1)
Sodium: 138 mmol/L (ref 135–145)
Total Bilirubin: 1.2 mg/dL (ref 0.3–1.2)
Total Protein: 7.4 g/dL (ref 6.5–8.1)

## 2020-05-12 LAB — CBC WITH DIFFERENTIAL/PLATELET
Abs Immature Granulocytes: 0.02 10*3/uL (ref 0.00–0.07)
Basophils Absolute: 0 10*3/uL (ref 0.0–0.1)
Basophils Relative: 1 %
Eosinophils Absolute: 0.1 10*3/uL (ref 0.0–0.5)
Eosinophils Relative: 2 %
HCT: 39.2 % (ref 36.0–46.0)
Hemoglobin: 13.4 g/dL (ref 12.0–15.0)
Immature Granulocytes: 0 %
Lymphocytes Relative: 42 %
Lymphs Abs: 2.1 10*3/uL (ref 0.7–4.0)
MCH: 30.3 pg (ref 26.0–34.0)
MCHC: 34.2 g/dL (ref 30.0–36.0)
MCV: 88.7 fL (ref 80.0–100.0)
Monocytes Absolute: 0.3 10*3/uL (ref 0.1–1.0)
Monocytes Relative: 6 %
Neutro Abs: 2.4 10*3/uL (ref 1.7–7.7)
Neutrophils Relative %: 49 %
Platelets: 306 10*3/uL (ref 150–400)
RBC: 4.42 MIL/uL (ref 3.87–5.11)
RDW: 11.8 % (ref 11.5–15.5)
WBC: 4.9 10*3/uL (ref 4.0–10.5)
nRBC: 0 % (ref 0.0–0.2)

## 2020-05-12 LAB — URINALYSIS, ROUTINE W REFLEX MICROSCOPIC
Bilirubin Urine: NEGATIVE
Glucose, UA: NEGATIVE mg/dL
Hgb urine dipstick: NEGATIVE
Ketones, ur: NEGATIVE mg/dL
Leukocytes,Ua: NEGATIVE
Nitrite: NEGATIVE
Protein, ur: NEGATIVE mg/dL
Specific Gravity, Urine: 1.015 (ref 1.005–1.030)
pH: 8 (ref 5.0–8.0)

## 2020-05-12 LAB — WET PREP, GENITAL
Sperm: NONE SEEN
Trich, Wet Prep: NONE SEEN

## 2020-05-12 LAB — LIPASE, BLOOD: Lipase: 30 U/L (ref 11–51)

## 2020-05-12 LAB — PREGNANCY, URINE: Preg Test, Ur: NEGATIVE

## 2020-05-12 MED ORDER — FLUCONAZOLE 150 MG PO TABS
150.0000 mg | ORAL_TABLET | Freq: Once | ORAL | Status: AC
  Administered 2020-05-12: 150 mg via ORAL
  Filled 2020-05-12: qty 1

## 2020-05-12 NOTE — ED Triage Notes (Signed)
Abdominal pain for a week. RUQ pain, dysuria, nausea and vaginal discharge.

## 2020-05-12 NOTE — ED Provider Notes (Signed)
[facility] EMERGENCY DEPARTMENT Provider Note   CSN: [id number] Arrival date & time: 05/12/20  1835     History No chief complaint on file.   Rebecca Bradley is a 39 y.o. female.  Patient with no past surgical history presents to the emergency department today with multiple complaints.  She has had right upper quadrant pain that has been intermittent and associated with nausea over the past 1 week.  This is not necessarily changed with food or eating.  No vomiting.  No fevers, chest pain or cough.  She has had increased frequency and urgency with minimal dysuria.  No hematuria.  She has had some mild suprapubic tenderness.  No flank pain.  In addition, she has noted a white vaginal discharge.  No vaginal bleeding.  She is sexually active with a partner and does not know their STI status.  No treatments prior to arrival.        Past Medical History:  Diagnosis Date  . Anxiety   . BV (bacterial vaginosis)   . Medical history non-contributory   . Pregnant     There are no problems to display for this patient.   Past Surgical History:  Procedure Laterality Date  . NO PAST SURGERIES       OB History    Gravida  3   Para  1   Term  1   Preterm      AB  1   Living  1     SAB  1   TAB      Ectopic      Multiple      Live Births  1           No family history on file.  Social History   Tobacco Use  . Smoking status: Current Every Day Smoker    Packs/day: 0.00  . Smokeless tobacco: Never Used  Substance Use Topics  . Alcohol use: No  . Drug use: No    Home Medications Prior to Admission medications   Medication Sig Start Date End Date Taking? Authorizing Provider  metroNIDAZOLE (FLAGYL) 500 MG tablet Take 1 tablet (500 mg total) by mouth 2 (two) times daily. One po bid x 7 days 09/18/19   Dione Booze, MD  ranitidine (ZANTAC) 150 MG tablet Take 1 tablet (150 mg total) by mouth 2 (two) times daily. 01/01/18 07/26/19  Rasch, Harolyn Rutherford, NP      Allergies    Patient has no known allergies.  Review of Systems   Review of Systems  Constitutional: Negative for fever.  HENT: Negative for rhinorrhea and sore throat.   Eyes: Negative for redness.  Respiratory: Negative for cough.   Cardiovascular: Negative for chest pain.  Gastrointestinal: Positive for abdominal pain and nausea. Negative for diarrhea and vomiting.  Genitourinary: Positive for dysuria, frequency, urgency and vaginal discharge. Negative for flank pain and hematuria.  Musculoskeletal: Negative for myalgias.  Skin: Negative for rash.  Neurological: Negative for headaches.    Physical Exam Updated Vital Signs BP (!) 142/105   Pulse 88   Temp 98.5 F (36.9 C) (Oral)   Resp 18   Ht 5\' 5"  (1.651 m)   Wt 72.6 kg   SpO2 100%   BMI 26.63 kg/m   Physical Exam Vitals and nursing note reviewed. Exam conducted with a chaperone present.  Constitutional:      Appearance: She is well-developed.  HENT:     Head: Normocephalic and atraumatic.  Eyes:  General:        Right eye: No discharge.        Left eye: No discharge.     Conjunctiva/sclera: Conjunctivae normal.  Cardiovascular:     Rate and Rhythm: Normal rate and regular rhythm.     Heart sounds: Normal heart sounds.  Pulmonary:     Effort: Pulmonary effort is normal.     Breath sounds: Normal breath sounds.  Abdominal:     Palpations: Abdomen is soft.     Tenderness: There is abdominal tenderness. There is no guarding or rebound.     Comments: Mild right upper quadrant abdominal pain to palpation, negative Murphy's.  Mild suprapubic tenderness.  Genitourinary:    Exam position: Lithotomy position.     Labia:        Right: No rash or tenderness.        Left: No rash or tenderness.      Vagina: Vaginal discharge (thick, white, heterogenous) present. No bleeding.     Cervix: Discharge present. No cervical motion tenderness.     Uterus: Not tender.      Adnexa:        Right: No mass or  tenderness.         Left: No mass or tenderness.       Comments: Small cyst noted L ext labia  Musculoskeletal:     Cervical back: Normal range of motion and neck supple.  Skin:    General: Skin is warm and dry.  Neurological:     Mental Status: She is alert.     ED Results / Procedures / Treatments   Labs (all labs ordered are listed, but only abnormal results are displayed) Labs Reviewed  WET PREP, GENITAL - Abnormal; Notable for the following components:      Result Value   Yeast Wet Prep HPF POC PRESENT (*)    Clue Cells Wet Prep HPF POC PRESENT (*)    WBC, Wet Prep HPF POC MANY (*)    All other components within normal limits  URINALYSIS, ROUTINE W REFLEX MICROSCOPIC  PREGNANCY, URINE  CBC WITH DIFFERENTIAL/PLATELET  COMPREHENSIVE METABOLIC PANEL  LIPASE, BLOOD  GC/CHLAMYDIA PROBE AMP ([facility]) NOT AT [facility]    EKG None  Radiology US Abdomen Limited RUQ  Result Date: 05/12/2020 CLINICAL DATA:  Right upper quadrant abdominal pain EXAM: ULTRASOUND ABDOMEN LIMITED RIGHT UPPER QUADRANT COMPARISON:  None. FINDINGS: Gallbladder: Comet tail artifact along the gallbladder with borderline gallbladder wall thickening compatible with adenomyomatosis. This is along the neck the gallbladder. Suspected wall thickening in the neck of the gallbladder in an unusual pattern which is mostly hypodense but mildly heterogeneous. This may simply be from sludge filling this portion of the gallbladder but there is also a central bandlike component which may reflect mucosal margin along a thick-walled segment. This is shown for example on image 15/1. Numerous small sandlike calculi are present in the gallbladder causing acoustic shadowing for example on 107/2. Common bile duct: Diameter: 0.4 cm Liver: No focal lesion identified. Within normal limits in parenchymal echogenicity. Portal vein is patent on color Doppler imaging with normal direction of blood flow towards the liver. Other: None.  IMPRESSION: 1. Cholelithiasis. 2. Gallbladder adenomyosis. 3. Unusual appearance of the gallbladder neck, query abnormal mucosal wall thickening versus sludge filling this region. 4. If further assessment for patency of the cystic duct is warranted in exclusion of cholecystitis, nuclear medicine hepatobiliary scan might be considered. Electronically Signed   By: Zollie Beckers  Janeece Fitting M.D.   On: 05/12/2020 20:21    Procedures Procedures (including critical care time)  Medications Ordered in ED Medications  fluconazole (DIFLUCAN) tablet 150 mg (150 mg Oral Given 05/12/20 2052)    ED Course  I have reviewed the triage vital signs and the nursing notes.  Pertinent labs & imaging results that were available during my care of the patient were reviewed by me and considered in my medical decision making (see chart for details).  Patient seen and examined. Work-up initiated.   Vital signs reviewed and are as follows: BP (!) 142/105   Pulse 88   Temp 98.5 F (36.9 C) (Oral)   Resp 18   Ht 5\' 5"  (1.651 m)   Wt 72.6 kg   SpO2 100%   BMI 26.63 kg/m   7:18 PM Pelvic performed NT chaperone.   9:03 PM work-up completed.  Patient continues to appear well.  Patient was given Diflucan for vaginal candidal infection.  She will use over-the-counter medications and avoidance of greasy and fatty foods for treatment of her right upper quadrant pain.  She is given PCP and general surgery referral.  We discussed that there is some appearance of thickening of her gallbladder, however with her normal white blood cell count and minimal tenderness on exam, I have low suspicion for gallbladder infection at this time.  She is encouraged to return with worsening, uncontrolled pain especially if she has fever or persistent vomiting.  Patient verbalizes understanding and agrees with plan.    MDM Rules/Calculators/A&P                      RUQ pain: Symptoms are suggestive of cholelithiasis.  Patient does not have  elevated white blood cell count, fever to suggest acute cholecystitis.  Negative Murphy sign.  Patient does have adenomyosis of the gallbladder --however given her exam, I do not feel that this thickening is consistent with focal cholecystitis.  Labs are very reassuring.  Patient does not have tenderness in other areas to suggest other etiology of her symptoms.  I do not feel that she requires CT imaging at this point.  Vaginal discharge, urine frequency: Pelvic exam performed.  Patient with a heterogeneous thick white discharge consistent with candidiasis.  Yeast seen on wet prep.  UA is clear without signs of infection.  She was given Diflucan.   Final Clinical Impression(s) / ED Diagnoses Final diagnoses:  RUQ pain  Calculus of gallbladder without cholecystitis without obstruction  Vaginal candidiasis    Rx / DC Orders ED Discharge Orders    None       Suann Larry 05/12/20 2107    Virgel Manifold, MD 05/13/20 0001

## 2020-05-12 NOTE — Discharge Instructions (Signed)
Please read and follow all provided instructions.  Your diagnoses today include:  1. Calculus of gallbladder without cholecystitis without obstruction   2. RUQ pain   3. Vaginal candidiasis     Tests performed today include:  Blood counts and electrolytes  Blood tests to check liver and kidney function  Blood tests to check pancreas function  Urine test to look for infection and pregnancy (in women)  Wet prep -shows vaginal infection with yeast  Ultrasound -shows gallstones in the gallbladder and some possible thickening, but your exam tonight is not suggestive of a gallbladder infection  Vital signs. See below for your results today.   Medications prescribed:   None  Take any prescribed medications only as directed.  Home care instructions:   Follow any educational materials contained in this packet.  Follow-up instructions: Please follow-up with the general surgeon listed in the next 1 week for recheck of your gallbladder symptoms.  Please avoid greasy and fatty foods or other foods that make your pain worse.  I have also provided a referral for a primary care doctor.  Return instructions:  SEEK IMMEDIATE MEDICAL ATTENTION IF:  The pain does not go away or becomes severe   A temperature above 101F develops   Repeated vomiting occurs (multiple episodes)   The pain becomes localized to portions of the abdomen. The right side could possibly be appendicitis. In an adult, the left lower portion of the abdomen could be colitis or diverticulitis.   Blood is being passed in stools or vomit (bright red or black tarry stools)   You develop chest pain, difficulty breathing, dizziness or fainting, or become confused, poorly responsive, or inconsolable (young children)  If you have any other emergent concerns regarding your health  Additional Information: Abdominal (belly) pain can be caused by many things. Your caregiver performed an examination and possibly ordered  blood/urine tests and imaging (CT scan, x-rays, ultrasound). Many cases can be observed and treated at home after initial evaluation in the emergency department. Even though you are being discharged home, abdominal pain can be unpredictable. Therefore, you need a repeated exam if your pain does not resolve, returns, or worsens. Most patients with abdominal pain don't have to be admitted to the hospital or have surgery, but serious problems like appendicitis and gallbladder attacks can start out as nonspecific pain. Many abdominal conditions cannot be diagnosed in one visit, so follow-up evaluations are very important.  Your vital signs today were: BP (!) 124/93 (BP Location: Right Arm)   Pulse 71   Temp 98.5 F (36.9 C) (Oral)   Resp 16   Ht 5\' 5"  (1.651 m)   Wt 72.6 kg   SpO2 100%   BMI 26.63 kg/m  If your blood pressure (bp) was elevated above 135/85 this visit, please have this repeated by your doctor within one month. --------------

## 2020-05-13 LAB — GC/CHLAMYDIA PROBE AMP (~~LOC~~) NOT AT ARMC
Chlamydia: NEGATIVE
Comment: NEGATIVE
Comment: NORMAL
Neisseria Gonorrhea: NEGATIVE

## 2020-06-19 ENCOUNTER — Ambulatory Visit: Payer: Self-pay | Admitting: Surgery

## 2020-06-19 NOTE — H&P (Signed)
Surgical Evaluation   HPI: Very pleasant 39 year old woman who is referred by the emergency department for symptomatic cholelithiasis. She presented there on June 1 with right upper quadrant pain which was intermittent and associated with nausea for the preceding week. Not necessarily associated with eating. No associated vomiting. No fevers. Prior to this she had occasional self-limited bouts of pain but these would last less than a day. Since her visit to the emergency room she has been doing well; she has had a couple of times where she felt some mild pain recurring however has been controlling it with a low-fat diet. CMP and CBC were performed at that time which was unremarkable, and right upper quadrant ultrasound demonstrated numerous sand-like calculi in the gallbladder as well as possible sludge filling the gallbladder neck versus abnormal mucosal wall thickening and gallbladder adenomyosis. She is otherwise healthy and has never had any abdominal surgery. 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  No Known Allergies  Past Medical History:  Diagnosis Date  . Anxiety   . BV (bacterial vaginosis)   . Medical history non-contributory   . Pregnant     Past Surgical History:  Procedure Laterality Date  . NO PAST SURGERIES      No family history on file.  Social History   Socioeconomic History  . Marital status: Single    Spouse name: Not on file  . Number of children: Not on file  . Years of education: Not on file  . Highest education level: Not on file  Occupational History  . Not on file  Tobacco Use  . Smoking status: Current Every Day Smoker    Packs/day: 0.00  . Smokeless tobacco: Never Used  Vaping Use  . Vaping Use: Never used  Substance and Sexual Activity  . Alcohol use: No  . Drug use: No  . Sexual activity: Not on file  Other Topics Concern  . Not on file  Social History Narrative  . Not on file   Social Determinants of Health    Financial Resource Strain:   . Difficulty of Paying Living Expenses:   Food Insecurity:   . Worried About Programme researcher, broadcasting/film/video in the Last Year:   . Barista in the Last Year:   Transportation Needs:   . Freight forwarder (Medical):   Marland Kitchen Lack of Transportation (Non-Medical):   Physical Activity:   . Days of Exercise per Week:   . Minutes of Exercise per Session:   Stress:   . Feeling of Stress :   Social Connections:   . Frequency of Communication with Friends and Family:   . Frequency of Social Gatherings with Friends and Family:   . Attends Religious Services:   . Active Member of Clubs or Organizations:   . Attends Banker Meetings:   Marland Kitchen Marital Status:     Current Outpatient Medications on File Prior to Visit  Medication Sig Dispense Refill  . metroNIDAZOLE (FLAGYL) 500 MG tablet Take 1 tablet (500 mg total) by mouth 2 (two) times daily. One po bid x 7 days 14 tablet 0  . [DISCONTINUED] ranitidine (ZANTAC) 150 MG tablet Take 1 tablet (150 mg total) by mouth 2 (two) times daily. 60 tablet 0   No current facility-administered medications on file prior to visit.    Review of Systems: a complete, 10pt review of systems was completed with pertinent positives and negatives as documented in the HPI  Physical Exam: Vitals  Weight: 171.25 lb Height: 65in Body Surface Area: 1.85 m Body Mass Index: 28.5 kg/m  Temp.: 97.41F  Pulse: 110 (Regular)  BP: 120/78(Sitting, Left Arm, Standard)  Alert and well-appearing Unlabored respirations Abdomen soft, nontender, nondistended. No mass or organomegaly. No lower extremity edema.    CBC Latest Ref Rng & Units 05/12/2020 09/05/2019 12/15/2017  WBC 4.0 - 10.5 K/uL 4.9 5.7 6.0  Hemoglobin 12.0 - 15.0 g/dL 91.4 78.2 11.4(L)  Hematocrit 36 - 46 % 39.2 38.7 32.3(L)  Platelets 150 - 400 K/uL 306 244 230    CMP Latest Ref Rng & Units 05/12/2020 09/05/2019 11/25/2017  Glucose 70 - 99 mg/dL 97 87 86  BUN 6  - 20 mg/dL 12 17 9   Creatinine 0.44 - 1.00 mg/dL 9.56 2.13  Sodium 135 - 145 mmol/L 138 139 134(L)  Potassium 3.5 - 5.1 mmol/L 3.8 4.0 3.6  Chloride 98 - 111 mmol/L 103 105 104  CO2 22 - 32 mmol/L 25 24 23   Calcium 8.9 - 10.3 mg/dL 9.4 9.7 9.6  Total Protein 6.5 - 8.1 g/dL 7.4 - 7.2  Total Bilirubin 0.3 - 1.2 mg/dL 1.2 - 1.0  Alkaline Phos 38 - 126 U/L 55 - 45  AST 15 - 41 U/L 17 - 16  ALT 0 - 44 U/L 13 - 11(L)    No results found for: INR, PROTIME  Imaging: No results found.   A/P: BILIARY COLIC (K80.50) Story: Along with abnormal findings on the gallbladder ultrasound. We discussed the relevant anatomy and pathology, using a diagram to demonstrate. I recommend proceeding with laparoscopic or robotic cholecystectomy with possible cholangiogram. Discussed risks of surgery including bleeding, pain, scarring, intraabdominal injury specifically to the common bile duct and sequelae, bile leak, conversion to open surgery, failure to resolve symptoms, blood clots/ pulmonary embolus, heart attack, pneumonia, stroke, death. Questions welcomed and answered to patient's satisfaction. She would like to proceed with scheduling.    There are no problems to display for this patient.      0.86, MD [facility] Surgery, Phylliss Blakes  See AMION to contact appropriate on-call provider

## 2020-08-10 ENCOUNTER — Emergency Department (HOSPITAL_BASED_OUTPATIENT_CLINIC_OR_DEPARTMENT_OTHER)
Admission: EM | Admit: 2020-08-10 | Discharge: 2020-08-10 | Disposition: A | Discharge status: Home / Self Care | Payer: HRSA Program | Attending: Emergency Medicine | Admitting: Emergency Medicine

## 2020-08-10 ENCOUNTER — Encounter (HOSPITAL_BASED_OUTPATIENT_CLINIC_OR_DEPARTMENT_OTHER): Payer: Self-pay | Admitting: *Deleted

## 2020-08-10 ENCOUNTER — Other Ambulatory Visit: Payer: Self-pay

## 2020-08-10 DIAGNOSIS — Z5321 Procedure and treatment not carried out due to patient leaving prior to being seen by health care provider: Secondary | ICD-10-CM | POA: Insufficient documentation

## 2020-08-10 DIAGNOSIS — J069 Acute upper respiratory infection, unspecified: Secondary | ICD-10-CM | POA: Insufficient documentation

## 2020-08-10 DIAGNOSIS — U071 COVID-19: Secondary | ICD-10-CM | POA: Insufficient documentation

## 2020-08-10 LAB — SARS CORONAVIRUS 2 BY RT PCR (HOSPITAL ORDER, PERFORMED IN ~~LOC~~ HOSPITAL LAB): SARS Coronavirus 2: POSITIVE — AB

## 2020-08-10 NOTE — ED Triage Notes (Signed)
C/o covid symptoms x 2 days 

## 2020-08-11 ENCOUNTER — Telehealth: Payer: Self-pay | Admitting: Family

## 2020-08-11 NOTE — Telephone Encounter (Signed)
Called to Discuss with patient about Covid symptoms and the use of the monoclonal antibody infusion for those with mild to moderate Covid symptoms and at a high risk of hospitalization.     Pt appears to qualify for this infusion due to co-morbid conditions and/or a member of an at-risk group in accordance with the FDA Emergency Use Authorization.  Rebecca Bradley was seen at the [facility] ED on 8/30 with positive COVID test. Currently having symptoms including congestion, change in taste/smell, and fatigue. Symptom onset was 8/28. Risk factors include BMI >25.   Discussed information about the treatment and she would like to think about it. Infusion Hotline information provided and sent to Mychart with additional resources. She will call back if wishing to pursue.   Marcos Eke, NP

## 2020-08-12 ENCOUNTER — Emergency Department (HOSPITAL_BASED_OUTPATIENT_CLINIC_OR_DEPARTMENT_OTHER)
Admission: EM | Admit: 2020-08-12 | Discharge: 2020-08-12 | Disposition: A | Discharge status: Home / Self Care | Payer: 59 | Attending: Emergency Medicine | Admitting: Emergency Medicine

## 2020-08-12 ENCOUNTER — Emergency Department (HOSPITAL_BASED_OUTPATIENT_CLINIC_OR_DEPARTMENT_OTHER): Payer: 59

## 2020-08-12 ENCOUNTER — Encounter (HOSPITAL_BASED_OUTPATIENT_CLINIC_OR_DEPARTMENT_OTHER): Payer: Self-pay | Admitting: Emergency Medicine

## 2020-08-12 ENCOUNTER — Other Ambulatory Visit: Payer: Self-pay

## 2020-08-12 DIAGNOSIS — R05 Cough: Secondary | ICD-10-CM | POA: Diagnosis present

## 2020-08-12 DIAGNOSIS — Z79899 Other long term (current) drug therapy: Secondary | ICD-10-CM | POA: Insufficient documentation

## 2020-08-12 DIAGNOSIS — F17200 Nicotine dependence, unspecified, uncomplicated: Secondary | ICD-10-CM | POA: Insufficient documentation

## 2020-08-12 DIAGNOSIS — U071 COVID-19: Secondary | ICD-10-CM | POA: Insufficient documentation

## 2020-08-12 MED ORDER — BENZONATATE 100 MG PO CAPS
100.0000 mg | ORAL_CAPSULE | Freq: Three times a day (TID) | ORAL | 0 refills | Status: DC
Start: 2020-08-12 — End: 2020-08-12

## 2020-08-12 MED ORDER — BENZONATATE 100 MG PO CAPS
100.0000 mg | ORAL_CAPSULE | Freq: Three times a day (TID) | ORAL | 0 refills | Status: DC
Start: 2020-08-12 — End: 2020-10-09

## 2020-08-12 MED ORDER — FLUTICASONE PROPIONATE 50 MCG/ACT NA SUSP
2.0000 | Freq: Every day | NASAL | 0 refills | Status: DC
Start: 2020-08-12 — End: 2020-10-09

## 2020-08-12 MED ORDER — FLUTICASONE PROPIONATE 50 MCG/ACT NA SUSP
2.0000 | Freq: Every day | NASAL | 0 refills | Status: DC
Start: 2020-08-12 — End: 2020-08-12

## 2020-08-12 NOTE — ED Triage Notes (Signed)
Pt tested positive two days ago and was positive.  Pt has cough, fatigue, chest tightness, not feeling well.

## 2020-08-12 NOTE — ED Provider Notes (Signed)
[facility] EMERGENCY DEPARTMENT Provider Note   CSN: [id number] Arrival date & time: 08/12/20  1054    History Chief Complaint  Patient presents with  . Cough    Rebecca Bradley is a 39 y.o. female with past medical history significant for recent Covid diagnoses who presents for evaluation of cough, fatigue.  Patient states she does get some chest tightness when she coughs.  Resolves when coughing resolves.  Is been tolerating p.o. intake at home without difficulty.,  Lightness, dizziness, shortness of breath, hemoptysis abdominal pain, diarrhea, dysuria, unilateral leg swelling, redness, warmth.  Denies additional aggravating or alleviating factors.   History obtained from patient and past medical records.  No interpreter is used.  HPI     Past Medical History:  Diagnosis Date  . Anxiety   . BV (bacterial vaginosis)   . Medical history non-contributory   . Pregnant     There are no problems to display for this patient.   Past Surgical History:  Procedure Laterality Date  . NO PAST SURGERIES       OB History    Gravida  3   Para  1   Term  1   Preterm      AB  1   Living  1     SAB  1   TAB      Ectopic      Multiple      Live Births  1           No family history on file.  Social History   Tobacco Use  . Smoking status: Current Every Day Smoker    Packs/day: 0.00  . Smokeless tobacco: Never Used  Vaping Use  . Vaping Use: Never used  Substance Use Topics  . Alcohol use: No  . Drug use: No    Home Medications Prior to Admission medications   Medication Sig Start Date End Date Taking? Authorizing Provider  benzonatate (TESSALON) 100 MG capsule Take 1 capsule (100 mg total) by mouth every 8 (eight) hours. 08/12/20   Jakita Dutkiewicz A, PA-C  fluticasone (FLONASE) 50 MCG/ACT nasal spray Place 2 sprays into both nostrils daily. 08/12/20   Effa Yarrow A, PA-C  metroNIDAZOLE (FLAGYL) 500 MG tablet Take 1 tablet (500 mg total)  by mouth 2 (two) times daily. One po bid x 7 days 09/18/19   Dione Booze, MD  ranitidine (ZANTAC) 150 MG tablet Take 1 tablet (150 mg total) by mouth 2 (two) times daily. 01/01/18 07/26/19  Rasch, Harolyn Rutherford, NP    Allergies    Patient has no known allergies.  Review of Systems   Review of Systems  Constitutional: Positive for activity change, appetite change and fatigue.  HENT: Positive for congestion, postnasal drip and rhinorrhea.   Respiratory: Positive for cough. Negative for apnea, choking, chest tightness, shortness of breath and wheezing.   Cardiovascular: Positive for chest pain (With coughing).  Gastrointestinal: Negative.   Genitourinary: Negative.   Musculoskeletal: Negative.   Skin: Negative.   Neurological: Negative.   All other systems reviewed and are negative.   Physical Exam Updated Vital Signs BP 111/83   Pulse 95   Temp 99.2 F (37.3 C)   Resp 16   Ht 5\' 5"  (1.651 m)   Wt 76.2 kg   SpO2 100%   BMI 27.96 kg/m   Physical Exam Vitals and nursing note reviewed.  Constitutional:      General: She is not in acute distress.  Appearance: She is not ill-appearing, toxic-appearing or diaphoretic.  HENT:     Head: Normocephalic and atraumatic.     Jaw: There is normal jaw occlusion.     Right Ear: Tympanic membrane, ear canal and external ear normal. There is no impacted cerumen. No hemotympanum. Tympanic membrane is not injected, scarred, perforated, erythematous, retracted or bulging.     Left Ear: Tympanic membrane, ear canal and external ear normal. There is no impacted cerumen. No hemotympanum. Tympanic membrane is not injected, scarred, perforated, erythematous, retracted or bulging.     Ears:     Comments: No Mastoid tenderness.    Nose:     Comments: Clear rhinorrhea and congestion to bilateral nares.  No sinus tenderness.    Mouth/Throat:     Comments: Posterior oropharynx clear.  Mucous membranes moist.  Tonsils without erythema or exudate.  Uvula  midline without deviation.  No evidence of PTA or RPA.  No drooling, dysphasia or trismus.  Phonation normal. Neck:     Trachea: Trachea and phonation normal.     Meningeal: Brudzinski's sign and Kernig's sign absent.     Comments: No Neck stiffness or neck rigidity.  No meningismus.  No cervical lymphadenopathy. Cardiovascular:     Comments: No murmurs rubs or gallops. Pulmonary:     Comments: Clear to auscultation bilaterally without wheeze, rhonchi or rales.  No accessory muscle usage.  Able speak in full sentences. Chest:     Comments: Equal rise and fall to chest Abdominal:     Comments: Soft, nontender without rebound or guarding.  No CVA tenderness.  Musculoskeletal:     Comments: Moves all 4 extremities without difficulty.  Lower extremities without edema, erythema or warmth.  Skin:    Comments: Brisk capillary refill.  No rashes or lesions.  Neurological:     Mental Status: She is alert.     Comments: Ambulatory in department without difficulty.  Cranial nerves II through XII grossly intact.  No facial droop.  No aphasia.    ED Results / Procedures / Treatments   Labs (all labs ordered are listed, but only abnormal results are displayed) Labs Reviewed - No data to display  EKG None  Radiology DG Chest Portable 1 View  Result Date: 08/12/2020 CLINICAL DATA:  Chest pain, shortness of breath. Recent COVID diagnosis. EXAM: PORTABLE CHEST 1 VIEW COMPARISON:  09/05/2019 FINDINGS: The heart size and mediastinal contours are within normal limits. Both lungs are clear. No effusions. No pneumothorax. The visualized skeletal structures are unremarkable. IMPRESSION: No acute cardiopulmonary disease. Electronically Signed   By: Feliberto Harts MD   On: 08/12/2020 12:12   Procedures Procedures (including critical care time)  Medications Ordered in ED Medications - No data to display  ED Course  I have reviewed the triage vital signs and the nursing notes.  Pertinent labs &  imaging results that were available during my care of the patient were reviewed by me and considered in my medical decision making (see chart for details).  38 presents for evaluation of Covid symptoms.  Diagnosed 2 days ago.  She is afebrile, nonseptic, not ill-appearing.  Patient appears overall well.  Heart lungs clear.  Abdomen soft, nontender.  No unilateral leg swelling, redness or warmth.  Denna Haggard' sign negative.  Patient does get some chest pain when she coughs however once her coughing has resolved her chest pain resolves.  I am able to reproduce this on palpation.  Likely musculoskeletal chest wall pain.  Low suspicion for  ACS, PE, dissection.   Chest x-ray today does not show evidence of infiltrate, cardiomegaly, pulmonary edema, pneumothorax EKG without ST changes.  No STEMI.  We will prescribe symptomatic management of her Covid infection.  I discussed return precautions.  Patient was explained agreeable follow-up.  The patient has been appropriately medically screened and/or stabilized in the ED. I have low suspicion for any other emergent medical condition which would require further screening, evaluation or treatment in the ED or require inpatient management.  Patient is hemodynamically stable and in no acute distress.  Patient able to ambulate in department prior to ED.  Evaluation does not show acute pathology that would require ongoing or additional emergent interventions while in the emergency department or further inpatient treatment.  I have discussed the diagnosis with the patient and answered all questions.  Pain is been managed while in the emergency department and patient has no further complaints prior to discharge.  Patient is comfortable with plan discussed in room and is stable for discharge at this time.  I have discussed strict return precautions for returning to the emergency department.  Patient was encouraged to follow-up with PCP/specialist refer to at discharge.      MDM Rules/Calculators/A&P                          Rebecca Bradley was evaluated in Emergency Department on 08/12/2020 for the symptoms described in the history of present illness. She was evaluated in the context of the global COVID-19 pandemic, which necessitated consideration that the patient might be at risk for infection with the SARS-CoV-2 virus that causes COVID-19. Institutional protocols and algorithms that pertain to the evaluation of patients at risk for COVID-19 are in a state of rapid change based on information released by regulatory bodies including the CDC and federal and state organizations. These policies and algorithms were followed during the patient's care in the ED. Final Clinical Impression(s) / ED Diagnoses Final diagnoses:  COVID-19    Rx / DC Orders ED Discharge Orders         Ordered    benzonatate (TESSALON) 100 MG capsule  Every 8 hours        08/12/20 1439    fluticasone (FLONASE) 50 MCG/ACT nasal spray  Daily        08/12/20 1439           Nadina Fomby A, PA-C 08/12/20 1442    Melene Plan, DO 08/12/20 1457

## 2020-08-12 NOTE — Discharge Instructions (Signed)
Take the medications as prescribed.  You may also continue to take Mucinex.  Return for new or worsening symptoms.

## 2020-10-09 ENCOUNTER — Emergency Department (HOSPITAL_BASED_OUTPATIENT_CLINIC_OR_DEPARTMENT_OTHER)
Admission: EM | Admit: 2020-10-09 | Discharge: 2020-10-09 | Disposition: A | Discharge status: Home / Self Care | Payer: 59 | Attending: Emergency Medicine | Admitting: Emergency Medicine

## 2020-10-09 ENCOUNTER — Other Ambulatory Visit: Payer: Self-pay

## 2020-10-09 ENCOUNTER — Encounter (HOSPITAL_BASED_OUTPATIENT_CLINIC_OR_DEPARTMENT_OTHER): Payer: Self-pay | Admitting: *Deleted

## 2020-10-09 DIAGNOSIS — B9689 Other specified bacterial agents as the cause of diseases classified elsewhere: Secondary | ICD-10-CM | POA: Insufficient documentation

## 2020-10-09 DIAGNOSIS — Z87891 Personal history of nicotine dependence: Secondary | ICD-10-CM | POA: Diagnosis not present

## 2020-10-09 DIAGNOSIS — N760 Acute vaginitis: Secondary | ICD-10-CM

## 2020-10-09 DIAGNOSIS — N898 Other specified noninflammatory disorders of vagina: Secondary | ICD-10-CM | POA: Diagnosis present

## 2020-10-09 LAB — URINALYSIS, ROUTINE W REFLEX MICROSCOPIC
Bilirubin Urine: NEGATIVE
Glucose, UA: NEGATIVE mg/dL
Hgb urine dipstick: NEGATIVE
Ketones, ur: NEGATIVE mg/dL
Nitrite: NEGATIVE
Protein, ur: NEGATIVE mg/dL
Specific Gravity, Urine: 1.02 (ref 1.005–1.030)
pH: 6.5 (ref 5.0–8.0)

## 2020-10-09 LAB — WET PREP, GENITAL
Sperm: NONE SEEN
Trich, Wet Prep: NONE SEEN
Yeast Wet Prep HPF POC: NONE SEEN

## 2020-10-09 LAB — URINALYSIS, MICROSCOPIC (REFLEX): RBC / HPF: NONE SEEN RBC/hpf (ref 0–5)

## 2020-10-09 LAB — PREGNANCY, URINE: Preg Test, Ur: NEGATIVE

## 2020-10-09 MED ORDER — METRONIDAZOLE 500 MG PO TABS: 500.0000 mg | ORAL_TABLET | Freq: Two times a day (BID) | ORAL | 0 refills | Status: DC

## 2020-10-09 NOTE — ED Notes (Signed)
Discharge instructions discussed. Follow up care and abx reviewed. Verbalized understanding

## 2020-10-09 NOTE — ED Provider Notes (Signed)
[facility] EMERGENCY DEPARTMENT Provider Note  CSN: [id number] Arrival date & time: 10/09/20 1935    History Chief Complaint  Patient presents with  . Vaginal Discharge    HPI  Rebecca Bradley is a 39 y.o. female with history of BV reports she has had a 'fluttering' sensation in her lower abdomen for the last 2 weeks. She has an IUD but was concerned about pregnancy and so she took several home pregnancy tests which were all normal. She has not had any significant pain, fever, vomiting or dysuria. She does report some thin white vaginal discharges. She last was sexually active about 3 weeks ago.    Past Medical History:  Diagnosis Date  . Anxiety   . BV (bacterial vaginosis)   . Medical history non-contributory   . Pregnant     Past Surgical History:  Procedure Laterality Date  . NO PAST SURGERIES      No family history on file.  Social History   Tobacco Use  . Smoking status: Former Smoker    Packs/day: 0.00  . Smokeless tobacco: Never Used  Vaping Use  . Vaping Use: Never used  Substance Use Topics  . Alcohol use: No  . Drug use: No     Home Medications Prior to Admission medications   Medication Sig Start Date End Date Taking? Authorizing Provider  metroNIDAZOLE (FLAGYL) 500 MG tablet Take 1 tablet (500 mg total) by mouth 2 (two) times daily. One po bid x 7 days 10/09/20   Pollyann Savoy, MD  fluticasone [facility]) 50 MCG/ACT nasal spray Place 2 sprays into both nostrils daily. 08/12/20 10/09/20  Henderly, Britni A, PA-C  ranitidine (ZANTAC) 150 MG tablet Take 1 tablet (150 mg total) by mouth 2 (two) times daily. 01/01/18 07/26/19  Rasch, Harolyn Rutherford, NP     Allergies    Patient has no known allergies.   Review of Systems   Review of Systems A comprehensive review of systems was completed and negative except as noted in HPI.     Physical Exam BP 124/89   Pulse 72   Temp 98.8 F (37.1 C) (Oral)   Resp 18   Ht 5\' 5"  (1.651 m)   Wt  79.7 kg   SpO2 100%   BMI 29.25 kg/m   Physical Exam Vitals and nursing note reviewed.  Constitutional:      Appearance: Normal appearance.  HENT:     Head: Normocephalic and atraumatic.     Nose: Nose normal.     Mouth/Throat:     Mouth: Mucous membranes are moist.  Eyes:     Extraocular Movements: Extraocular movements intact.     Conjunctiva/sclera: Conjunctivae normal.  Cardiovascular:     Rate and Rhythm: Normal rate.  Pulmonary:     Effort: Pulmonary effort is normal.     Breath sounds: Normal breath sounds.  Abdominal:     General: Abdomen is flat.     Palpations: Abdomen is soft.     Tenderness: There is no abdominal tenderness.  Genitourinary:    Comments: deferred Musculoskeletal:        General: No swelling. Normal range of motion.     Cervical back: Neck supple.  Skin:    General: Skin is warm and dry.  Neurological:     General: No focal deficit present.     Mental Status: She is alert.  Psychiatric:        Mood and Affect: Mood normal.  ED Results / Procedures / Treatments   Labs (all labs ordered are listed, but only abnormal results are displayed) Labs Reviewed  WET PREP, GENITAL - Abnormal; Notable for the following components:      Result Value   Clue Cells Wet Prep HPF POC PRESENT (*)    WBC, Wet Prep HPF POC FEW (*)    All other components within normal limits  URINALYSIS, ROUTINE W REFLEX MICROSCOPIC - Abnormal; Notable for the following components:   Leukocytes,Ua TRACE (*)    All other components within normal limits  URINALYSIS, MICROSCOPIC (REFLEX) - Abnormal; Notable for the following components:   Bacteria, UA RARE (*)    All other components within normal limits  PREGNANCY, URINE  GC/CHLAMYDIA PROBE AMP ([place]) NOT AT [facility]    EKG None   Radiology No results found.  Procedures Procedures  Medications Ordered in the ED Medications - No data to display   MDM Rules/Calculators/A&P MDM UA and HCG are neg.  Patient prefers self-swab for vaginal samples. Abdomen is benign, no indication for blood work or imaging.  ED Course  I have reviewed the triage vital signs and the nursing notes.  Pertinent labs & imaging results that were available during my care of the patient were reviewed by me and considered in my medical decision making (see chart for details).  Clinical Course as of Oct 09 2099  Fri Oct 09, 2020  2056 Wet prep positive for BV, patient prefers to wait on treatment for GC/CT pending swab. Will refer back to her Ob/Gyn for follow up if symptoms persist.    [CS]    Clinical Course User Index [CS] Pollyann Savoy, MD    Final Clinical Impression(s) / ED Diagnoses Final diagnoses:  BV (bacterial vaginosis)    Rx / DC Orders ED Discharge Orders         Ordered    metroNIDAZOLE (FLAGYL) 500 MG tablet  2 times daily        10/09/20 2059           Pollyann Savoy, MD 10/09/20 2100

## 2020-10-09 NOTE — ED Triage Notes (Signed)
Vaginal discharge. Urinary frequency. 

## 2020-10-12 LAB — GC/CHLAMYDIA PROBE AMP (~~LOC~~) NOT AT ARMC
Chlamydia: NEGATIVE
Comment: NEGATIVE
Comment: NORMAL
Neisseria Gonorrhea: NEGATIVE

## 2020-12-20 ENCOUNTER — Other Ambulatory Visit: Payer: Self-pay

## 2020-12-20 ENCOUNTER — Emergency Department (HOSPITAL_BASED_OUTPATIENT_CLINIC_OR_DEPARTMENT_OTHER)
Admission: EM | Admit: 2020-12-20 | Discharge: 2020-12-20 | Disposition: A | Discharge status: Home / Self Care | Payer: 59 | Attending: Emergency Medicine | Admitting: Emergency Medicine

## 2020-12-20 ENCOUNTER — Encounter (HOSPITAL_BASED_OUTPATIENT_CLINIC_OR_DEPARTMENT_OTHER): Payer: Self-pay

## 2020-12-20 ENCOUNTER — Emergency Department (HOSPITAL_BASED_OUTPATIENT_CLINIC_OR_DEPARTMENT_OTHER): Payer: 59

## 2020-12-20 DIAGNOSIS — Z87891 Personal history of nicotine dependence: Secondary | ICD-10-CM | POA: Diagnosis not present

## 2020-12-20 DIAGNOSIS — U071 COVID-19: Secondary | ICD-10-CM | POA: Diagnosis not present

## 2020-12-20 DIAGNOSIS — R079 Chest pain, unspecified: Secondary | ICD-10-CM

## 2020-12-20 LAB — BASIC METABOLIC PANEL
Anion gap: 11 (ref 5–15)
BUN: 14 mg/dL (ref 6–20)
CO2: 25 mmol/L (ref 22–32)
Calcium: 9.8 mg/dL (ref 8.9–10.3)
Chloride: 101 mmol/L (ref 98–111)
Creatinine, Ser: 0.87 mg/dL (ref 0.44–1.00)
GFR, Estimated: >60 mL/min (ref >60)
Glucose, Bld: 122 mg/dL — ABNORMAL HIGH (ref 70–99)
Potassium: 3.8 mmol/L (ref 3.5–5.1)
Sodium: 137 mmol/L (ref 135–145)

## 2020-12-20 LAB — CBC
HCT: 39.7 % (ref 36.0–46.0)
Hemoglobin: 13.8 g/dL (ref 12.0–15.0)
MCH: 30.5 pg (ref 26.0–34.0)
MCHC: 34.8 g/dL (ref 30.0–36.0)
MCV: 87.6 fL (ref 80.0–100.0)
Platelets: 270 10*3/uL (ref 150–400)
RBC: 4.53 MIL/uL (ref 3.87–5.11)
RDW: 11.8 % (ref 11.5–15.5)
WBC: 6.2 10*3/uL (ref 4.0–10.5)
nRBC: 0 % (ref 0.0–0.2)

## 2020-12-20 LAB — TROPONIN I (HIGH SENSITIVITY)
Troponin I (High Sensitivity): <2 ng/L (ref <18)
Troponin I (High Sensitivity): <2 ng/L (ref <18)

## 2020-12-20 LAB — D-DIMER, QUANTITATIVE: D-Dimer, Quant: 0.32 ug/mL-FEU (ref 0.00–0.50)

## 2020-12-20 LAB — PREGNANCY, URINE: Preg Test, Ur: NEGATIVE

## 2020-12-20 NOTE — ED Provider Notes (Signed)
[facility] EMERGENCY DEPARTMENT Provider Note   CSN: [id number] Arrival date & time: 12/20/20  1649     History Chief Complaint  Patient presents with  . Chest Pain    Rebecca Bradley is a 40 y.o. female.  HPI   Patient with no significant medical history presents to the emergency department with chief complaint of right-sided chest pain.  Patient states pain started approximately 1 weeks ago and has stayed consistent.  Patient describes the chest pain as a constant  sharp-like pain which she feels in her right lower ribs and sometimes feels in her right back.  She endorses occasional shortness of breath, denies exertion making the pain worse, denies associated nausea or vomiting, becoming diaphoretic.  She has no cardiac history, no history of DVTs or PEs, denies recent surgeries or long immobilization, she is on birth control.  Patient states she has been trying to take Tums without any relief, she denies alleviating factors.  Patient denies headaches, fevers, chills,abdominal pain, nausea, vomiting, diarrhea, pedal edema.  Past Medical History:  Diagnosis Date  . Anxiety   . BV (bacterial vaginosis)   . Medical history non-contributory   . Pregnant     There are no problems to display for this patient.   Past Surgical History:  Procedure Laterality Date  . NO PAST SURGERIES       OB History    Gravida  3   Para  1   Term  1   Preterm      AB  1   Living  1     SAB  1   IAB      Ectopic      Multiple      Live Births  1           History reviewed. No pertinent family history.  Social History   Tobacco Use  . Smoking status: Former Smoker    Packs/day: 0.00  . Smokeless tobacco: Never Used  Vaping Use  . Vaping Use: Never used  Substance Use Topics  . Alcohol use: No  . Drug use: No    Home Medications Prior to Admission medications   Medication Sig Start Date End Date Taking? Authorizing Provider  metroNIDAZOLE (FLAGYL) 500  MG tablet Take 1 tablet (500 mg total) by mouth 2 (two) times daily. One po bid x 7 days 10/09/20   Pollyann Savoy, MD  fluticasone [facility]) 50 MCG/ACT nasal spray Place 2 sprays into both nostrils daily. 08/12/20 10/09/20  Henderly, Britni A, PA-C  ranitidine (ZANTAC) 150 MG tablet Take 1 tablet (150 mg total) by mouth 2 (two) times daily. 01/01/18 07/26/19  Rasch, Harolyn Rutherford, NP    Allergies    Patient has no known allergies.  Review of Systems   Review of Systems  Constitutional: Negative for chills and fever.  HENT: Negative for congestion.   Respiratory: Positive for shortness of breath.   Cardiovascular: Positive for chest pain.  Gastrointestinal: Negative for abdominal pain, diarrhea, nausea and vomiting.  Genitourinary: Negative for enuresis.  Musculoskeletal: Negative for back pain.  Skin: Negative for rash.  Neurological: Negative for dizziness and headaches.  Hematological: Does not bruise/bleed easily.    Physical Exam Updated Vital Signs BP 122/88   Pulse 67   Temp 98.6 F (37 C) (Oral)   Resp (!) 23   Ht 5\' 5"  (1.651 m)   Wt 77.1 kg   SpO2 100%   BMI 28.29 kg/m  Physical Exam Vitals and nursing note reviewed.  Constitutional:      General: She is not in acute distress.    Appearance: She is not ill-appearing.  HENT:     Head: Normocephalic and atraumatic.     Nose: No congestion.  Eyes:     Conjunctiva/sclera: Conjunctivae normal.  Cardiovascular:     Rate and Rhythm: Normal rate and regular rhythm.     Pulses: Normal pulses.     Heart sounds: No murmur heard. No friction rub. No gallop.   Pulmonary:     Effort: No respiratory distress.     Breath sounds: No wheezing, rhonchi or rales.  Abdominal:     Palpations: Abdomen is soft.     Tenderness: There is no abdominal tenderness.  Musculoskeletal:     Right lower leg: No edema.     Left lower leg: No edema.     Comments: Patient's chest was palpated she had noted tenderness along her fourth  and fifth rib midclavicular line, there is no deformity or crepitus noted.  Skin:    General: Skin is warm and dry.  Neurological:     Mental Status: She is alert.  Psychiatric:        Mood and Affect: Mood normal.     ED Results / Procedures / Treatments   Labs (all labs ordered are listed, but only abnormal results are displayed) Labs Reviewed  BASIC METABOLIC PANEL - Abnormal; Notable for the following components:      Result Value   Glucose, Bld 122 (*)    All other components within normal limits  SARS CORONAVIRUS 2 (TAT 6-24 HRS)  CBC  PREGNANCY, URINE  D-DIMER, QUANTITATIVE (NOT AT [facility])  TROPONIN I (HIGH SENSITIVITY)  TROPONIN I (HIGH SENSITIVITY)    EKG EKG Interpretation  Date/Time:  Sunday December 20 2020 16:56:32 EST Ventricular Rate:  99 PR Interval:  190 QRS Duration: 70 QT Interval:  332 QTC Calculation: 426 R Axis:   -24 Text Interpretation: Normal sinus rhythm Anterior infarct , age undetermined Abnormal ECG Confirmed by Virgina Norfolk [phone]) on 12/20/2020 5:08:22 PM   Radiology DG Chest 2 View  Result Date: 12/20/2020 CLINICAL DATA:  Chest pain and shortness of breath. Right-sided discomfort. Cough. EXAM: CHEST - 2 VIEW COMPARISON:  08/12/2020 FINDINGS: Heart size is normal. Mediastinal shadows are normal. The lungs are clear. No bronchial thickening. No infiltrate, mass, effusion or collapse. Pulmonary vascularity is normal. No bony abnormality. IMPRESSION: Normal chest. Electronically Signed   By: Paulina Fusi M.D.   On: 12/20/2020 17:46    Procedures Procedures (including critical care time)  Medications Ordered in ED Medications - No data to display  ED Course  I have reviewed the triage vital signs and the nursing notes.  Pertinent labs & imaging results that were available during my care of the patient were reviewed by me and considered in my medical decision making (see chart for details).    MDM Rules/Calculators/A&P                           Patient presents with atypical chest pain for 1 week.  She is alert, does not appear in acute distress, vital signs reassuring.  Due to chest pain complaint will order chest pain work-up, add on D-dimer.   CBC negative for leukocytosis or signs anemia.  BMP negative for electrolyte abnormalities, shows slight hyperglycemia of 122, no AKI, no anion gap present.  Urine  pregnancy is negative.  D-dimer is -0.32, negative delta troponin.  Chest x-ray does not reveal any acute findings.  EKG sinus rhythm without signs of ischemia no ST elevation depression noted.  I have low suspicion for ACS as history is atypical, patient has no cardiac history, EKG was sinus rhythm without signs of ischemia, patient had a delta troponin.  Low suspicion for PE as patient denies pleuritic chest pain, shortness of breath, patient denies leg pain, no pedal edema noted on exam, patient was PERC negative, D-dimer is negative.  Low suspicion for AAA or aortic dissection as history is atypical, patient has low risk factors.  Low suspicion for systemic infection as patient is nontoxic-appearing, vital signs reassuring, no obvious source infection noted on exam.  Differential diagnosis for chest pain includes costochondritis, skeletal muscular strain, anxiety, GERD, esophageal spasm.  Will recommend over-the-counter pain medications follow-up with PCP for further evaluation.  Vital signs have remained stable, no indication for hospital admission. Patient given at home care as well strict return precautions.  Patient verbalized that they understood agreed to said plan.   Final Clinical Impression(s) / ED Diagnoses Final diagnoses:  Nonspecific chest pain    Rx / DC Orders ED Discharge Orders    None       Carroll Sage, PA-C 12/20/20 2046    Virgina Norfolk, DO 12/20/20 2320

## 2020-12-20 NOTE — ED Notes (Signed)
EDP at bedside  

## 2020-12-20 NOTE — ED Triage Notes (Signed)
Pt complaining of dull right chest pain under breast, with SOB and nausea x 1 week

## 2020-12-20 NOTE — Discharge Instructions (Addendum)
You been seen here for chest pain.  Lab work and imaging all looks reassuring.  I recommend taking over-the-counter pain medications like ibuprofen or Tylenol every 6 as needed please follow dosing the back of bottle.  Given you the contact information for community health and wellness they can help you find a primary care provider.  Come back to the emergency department if you develop chest pain, shortness of breath, severe abdominal pain, uncontrolled nausea, vomiting, diarrhea.

## 2020-12-21 LAB — SARS CORONAVIRUS 2 (TAT 6-24 HRS): SARS Coronavirus 2: POSITIVE — AB

## 2021-06-24 ENCOUNTER — Emergency Department (HOSPITAL_BASED_OUTPATIENT_CLINIC_OR_DEPARTMENT_OTHER)
Admission: EM | Admit: 2021-06-24 | Discharge: 2021-06-25 | Disposition: A | Discharge status: Home / Self Care | Payer: 59 | Attending: Emergency Medicine | Admitting: Emergency Medicine

## 2021-06-24 ENCOUNTER — Encounter (HOSPITAL_BASED_OUTPATIENT_CLINIC_OR_DEPARTMENT_OTHER): Payer: Self-pay

## 2021-06-24 ENCOUNTER — Emergency Department (HOSPITAL_BASED_OUTPATIENT_CLINIC_OR_DEPARTMENT_OTHER): Payer: 59

## 2021-06-24 ENCOUNTER — Other Ambulatory Visit: Payer: Self-pay

## 2021-06-24 DIAGNOSIS — Z87891 Personal history of nicotine dependence: Secondary | ICD-10-CM | POA: Insufficient documentation

## 2021-06-24 DIAGNOSIS — R079 Chest pain, unspecified: Secondary | ICD-10-CM | POA: Diagnosis present

## 2021-06-24 DIAGNOSIS — R630 Anorexia: Secondary | ICD-10-CM | POA: Diagnosis not present

## 2021-06-24 LAB — CBC
HCT: 39.9 % (ref 36.0–46.0)
Hemoglobin: 14.1 g/dL (ref 12.0–15.0)
MCH: 30.7 pg (ref 26.0–34.0)
MCHC: 35.3 g/dL (ref 30.0–36.0)
MCV: 86.7 fL (ref 80.0–100.0)
Platelets: 282 10*3/uL (ref 150–400)
RBC: 4.6 MIL/uL (ref 3.87–5.11)
RDW: 11.7 % (ref 11.5–15.5)
WBC: 8 10*3/uL (ref 4.0–10.5)
nRBC: 0 % (ref 0.0–0.2)

## 2021-06-24 LAB — BASIC METABOLIC PANEL
Anion gap: 8 (ref 5–15)
BUN: 12 mg/dL (ref 6–20)
CO2: 26 mmol/L (ref 22–32)
Calcium: 9.5 mg/dL (ref 8.9–10.3)
Chloride: 102 mmol/L (ref 98–111)
Creatinine, Ser: 0.8 mg/dL (ref 0.44–1.00)
GFR, Estimated: >60 mL/min (ref >60)
Glucose, Bld: 125 mg/dL — ABNORMAL HIGH (ref 70–99)
Potassium: 3.6 mmol/L (ref 3.5–5.1)
Sodium: 136 mmol/L (ref 135–145)

## 2021-06-24 LAB — TROPONIN I (HIGH SENSITIVITY): Troponin I (High Sensitivity): <2 ng/L (ref <18)

## 2021-06-24 LAB — PREGNANCY, URINE: Preg Test, Ur: NEGATIVE

## 2021-06-24 MED ORDER — ALUM & MAG HYDROXIDE-SIMETH 200-200-20 MG/5ML PO SUSP
30.0000 mL | Freq: Once | ORAL | Status: AC
  Administered 2021-06-24: 30 mL via ORAL
  Filled 2021-06-24: qty 30

## 2021-06-24 MED ORDER — HYDROXYZINE HCL 25 MG PO TABS: 25.0000 mg | ORAL_TABLET | Freq: Four times a day (QID) | ORAL | 0 refills | Status: DC | PRN

## 2021-06-24 MED ORDER — FAMOTIDINE 20 MG PO TABS
20.0000 mg | ORAL_TABLET | Freq: Two times a day (BID) | ORAL | 0 refills | Status: DC
Start: 2021-06-24 — End: 2021-06-25

## 2021-06-24 MED ORDER — FAMOTIDINE 20 MG PO TABS
40.0000 mg | ORAL_TABLET | Freq: Once | ORAL | Status: AC
  Administered 2021-06-24: 40 mg via ORAL
  Filled 2021-06-24: qty 2

## 2021-06-24 MED ORDER — LIDOCAINE VISCOUS HCL 2 % MT SOLN
15.0000 mL | Freq: Once | OROMUCOSAL | Status: AC
  Administered 2021-06-24: 15 mL via ORAL
  Filled 2021-06-24: qty 15

## 2021-06-24 NOTE — ED Triage Notes (Signed)
Pt c/o CP x 2 days-denies fever/flu sx-NAD-steady gait 

## 2021-06-24 NOTE — Discharge Instructions (Addendum)
1.  At this time your lab evaluation does not show any signs of heart attack.  Your chest x-ray does not show signs of pneumonia. 2.  You may be experiencing reflux.  Take Pepcid twice daily for the next 2 weeks.  Review instructions for diet and management of reflux disease per 3.  You have been given Vistaril to take if you are having anxiety.  You may take 1 tablet every 6 hours as needed.  This medication is only to be taken if needed.  It does not need to be taken on a regular basis.  It may make you feel slightly tired, do not try to operate machinery or do dangerous activities when taking it. 4.  Return to the emergency department if you find you are getting any worsening symptoms or new concerning symptoms.

## 2021-06-24 NOTE — ED Provider Notes (Signed)
[facility] EMERGENCY DEPARTMENT Provider Note   CSN: [id number] Arrival date & time: 06/24/21  1917     History Chief Complaint  Patient presents with   Chest Pain    Rebecca Bradley is a 40 y.o. female.  HPI Patient reports for couple days she has been getting some pain in her central lower chest.  She reports she was not sure if it might be reflux or her anxiety.  She reports finally she did get concerned and wanted to get it checked.  No associated cough, fever chills nausea or vomiting.  Patient reports she has had a little bit of loss of appetite but on the other hand she does feel hungry this evening.  She reports she had reflux during her pregnancy and used to be on Pepcid but is not taking it now.  He also reports her gallbladder was removed few months ago and that did seem to help with those symptoms.  No fever, no shortness of breath, no lower extremity swelling or calf pain.    Past Medical History:  Diagnosis Date   Anxiety    BV (bacterial vaginosis)    Medical history non-contributory    Pregnant     There are no problems to display for this patient.   Past Surgical History:  Procedure Laterality Date   CHOLECYSTECTOMY     NO PAST SURGERIES       OB History     Gravida  3   Para  1   Term  1   Preterm      AB  1   Living  1      SAB  1   IAB      Ectopic      Multiple      Live Births  1           No family history on file.  Social History   Tobacco Use   Smoking status: Former    Packs/day: 0.00    Types: Cigarettes   Smokeless tobacco: Never  Vaping Use   Vaping Use: Never used  Substance Use Topics   Alcohol use: No   Drug use: No    Home Medications Prior to Admission medications   Medication Sig Start Date End Date Taking? Authorizing Provider  famotidine (PEPCID) 20 MG tablet Take 1 tablet (20 mg total) by mouth 2 (two) times daily. 06/24/21  Yes Arby Barrette, MD  hydrOXYzine (ATARAX/VISTARIL) 25  MG tablet Take 1 tablet (25 mg total) by mouth every 6 (six) hours as needed for anxiety. 06/24/21  Yes Arby Barrette, MD  metroNIDAZOLE (FLAGYL) 500 MG tablet Take 1 tablet (500 mg total) by mouth 2 (two) times daily. One po bid x 7 days 10/09/20   Pollyann Savoy, MD  fluticasone [facility]) 50 MCG/ACT nasal spray Place 2 sprays into both nostrils daily. 08/12/20 10/09/20  Henderly, Britni A, PA-C  ranitidine (ZANTAC) 150 MG tablet Take 1 tablet (150 mg total) by mouth 2 (two) times daily. 01/01/18 07/26/19  Rasch, Harolyn Rutherford, NP    Allergies    Patient has no known allergies.  Review of Systems   Review of Systems 10 systems reviewed and negative except as per HPI Physical Exam Updated Vital Signs BP (!) 127/91   Pulse 82   Temp 98.6 F (37 C) (Oral)   Resp 18   Ht 5\' 5"  (1.651 m)   Wt 78 kg   LMP 06/17/2021   SpO2  100%   BMI 28.62 kg/m   Physical Exam Constitutional:      Appearance: Normal appearance.  HENT:     Mouth/Throat:     Pharynx: Oropharynx is clear.  Eyes:     Extraocular Movements: Extraocular movements intact.  Cardiovascular:     Rate and Rhythm: Normal rate and regular rhythm.  Pulmonary:     Effort: Pulmonary effort is normal.     Breath sounds: Normal breath sounds.  Abdominal:     General: There is no distension.     Palpations: Abdomen is soft.     Tenderness: There is no abdominal tenderness. There is no guarding.  Musculoskeletal:        General: No swelling or tenderness.     Right lower leg: No edema.     Left lower leg: No edema.     Comments: No peripheral edema.  Calves are soft and nontender.  Skin:    General: Skin is warm and dry.  Neurological:     General: No focal deficit present.     Mental Status: She is alert and oriented to person, place, and time.     Motor: No weakness.  Psychiatric:        Mood and Affect: Mood normal.    ED Results / Procedures / Treatments   Labs (all labs ordered are listed, but only abnormal  results are displayed) Labs Reviewed  BASIC METABOLIC PANEL - Abnormal; Notable for the following components:      Result Value   Glucose, Bld 125 (*)    All other components within normal limits  CBC  PREGNANCY, URINE  TROPONIN I (HIGH SENSITIVITY)  TROPONIN I (HIGH SENSITIVITY)    EKG EKG Interpretation  Date/Time:  Thursday June 24 2021 19:36:41 EDT Ventricular Rate:  94 PR Interval:  170 QRS Duration: 82 QT Interval:  368 QTC Calculation: 460 R Axis:   -31 Text Interpretation: Normal sinus rhythm Left axis deviation no change from previous Confirmed by Arby Barrette [phone]) on 06/24/2021 11:38:56 PM  Radiology DG Chest 2 View  Result Date: 06/24/2021 CLINICAL DATA:  41 year old female with chest pain. EXAM: CHEST - 2 VIEW COMPARISON:  Chest radiograph dated 12/20/2020 FINDINGS: The heart size and mediastinal contours are within normal limits. Both lungs are clear. The visualized skeletal structures are unremarkable. IMPRESSION: No active cardiopulmonary disease. Electronically Signed   By: Elgie Collard M.D.   On: 06/24/2021 20:22    Procedures Procedures   Medications Ordered in ED Medications  famotidine (PEPCID) tablet 40 mg (has no administration in time range)  alum & mag hydroxide-simeth (MAALOX/MYLANTA) 200-200-20 MG/5ML suspension 30 mL (has no administration in time range)    And  lidocaine (XYLOCAINE) 2 % viscous mouth solution 15 mL (has no administration in time range)    ED Course  I have reviewed the triage vital signs and the nursing notes.  Pertinent labs & imaging results that were available during my care of the patient were reviewed by me and considered in my medical decision making (see chart for details).    MDM Rules/Calculators/A&P                         Patient presents with chest pain has been waxing and waning for 2 days.  At this time EKG is unchanged from previous.  Troponins are negative.  Patient has normal exam with clear lungs.   No findings on chest x-ray.  No peripheral  edema or calf tenderness patient does report prior history of GERD during pregnancy.  She indicates pain more in the central lower chest.  At this time I suspect potential GERD.  Will recommend restarting Pepcid twice daily and dietary recommendations patient also reports concern for possible anxiety at this time will add as needed Atarax.  Patient will follow up with her PCP ASAP for recheck Final Clinical Impression(s) / ED Diagnoses Final diagnoses:  Nonspecific chest pain    Rx / DC Orders ED Discharge Orders          Ordered    famotidine (PEPCID) 20 MG tablet  2 times daily        06/24/21 2331    hydrOXYzine (ATARAX/VISTARIL) 25 MG tablet  Every 6 hours PRN        06/24/21 2331             Arby Barrette, MD 06/24/21 2342

## 2021-06-25 MED ORDER — FAMOTIDINE 20 MG PO TABS: 20.0000 mg | ORAL_TABLET | Freq: Two times a day (BID) | ORAL | 0 refills | Status: AC

## 2021-06-25 MED ORDER — HYDROXYZINE HCL 25 MG PO TABS: 25.0000 mg | ORAL_TABLET | Freq: Four times a day (QID) | ORAL | 0 refills | Status: AC | PRN

## 2021-09-20 ENCOUNTER — Emergency Department (HOSPITAL_BASED_OUTPATIENT_CLINIC_OR_DEPARTMENT_OTHER)
Admission: EM | Admit: 2021-09-20 | Discharge: 2021-09-20 | Disposition: A | Discharge status: Home / Self Care | Payer: 59 | Attending: Emergency Medicine | Admitting: Emergency Medicine

## 2021-09-20 ENCOUNTER — Other Ambulatory Visit: Payer: Self-pay

## 2021-09-20 ENCOUNTER — Emergency Department (HOSPITAL_BASED_OUTPATIENT_CLINIC_OR_DEPARTMENT_OTHER): Payer: 59 | Admitting: Radiology

## 2021-09-20 ENCOUNTER — Encounter (HOSPITAL_BASED_OUTPATIENT_CLINIC_OR_DEPARTMENT_OTHER): Payer: Self-pay | Admitting: Obstetrics and Gynecology

## 2021-09-20 DIAGNOSIS — R0602 Shortness of breath: Secondary | ICD-10-CM | POA: Diagnosis present

## 2021-09-20 DIAGNOSIS — U071 COVID-19: Secondary | ICD-10-CM | POA: Insufficient documentation

## 2021-09-20 DIAGNOSIS — R0981 Nasal congestion: Secondary | ICD-10-CM | POA: Diagnosis not present

## 2021-09-20 DIAGNOSIS — Z87891 Personal history of nicotine dependence: Secondary | ICD-10-CM | POA: Insufficient documentation

## 2021-09-20 LAB — RESP PANEL BY RT-PCR (FLU A&B, COVID) ARPGX2
Influenza A by PCR: NEGATIVE
Influenza B by PCR: NEGATIVE
SARS Coronavirus 2 by RT PCR: POSITIVE — AB

## 2021-09-20 MED ORDER — ALBUTEROL SULFATE HFA 108 (90 BASE) MCG/ACT IN AERS: 2.0000 | INHALATION_SPRAY | RESPIRATORY_TRACT | Status: DC | PRN

## 2021-09-20 NOTE — ED Provider Notes (Signed)
[facility] EMERGENCY DEPT Provider Note   CSN: [id number] Arrival date & time: 09/20/21  1936     History Chief Complaint  Patient presents with   Chest Pain   Shortness of Breath    Rebecca Bradley is a 40 y.o. female.  The history is provided by the patient.  URI Presenting symptoms: congestion, cough and fatigue   Severity:  Mild Timing:  Intermittent Progression:  Waxing and waning Chronicity:  New Relieved by:  Nothing Worsened by:  Nothing Associated symptoms: arthralgias   Associated symptoms: no headaches, no myalgias, no neck pain, no sinus pain, no sneezing, no swollen glands and no wheezing   Risk factors: no sick contacts       Past Medical History:  Diagnosis Date   Anxiety    BV (bacterial vaginosis)    Medical history non-contributory    Pregnant     There are no problems to display for this patient.   Past Surgical History:  Procedure Laterality Date   CHOLECYSTECTOMY     NO PAST SURGERIES       OB History     Gravida  3   Para  1   Term  1   Preterm      AB  1   Living  1      SAB  1   IAB      Ectopic      Multiple      Live Births  1           No family history on file.  Social History   Tobacco Use   Smoking status: Former    Packs/day: 0.00    Types: Cigarettes   Smokeless tobacco: Never  Vaping Use   Vaping Use: Never used  Substance Use Topics   Alcohol use: Yes    Comment: Social   Drug use: No    Home Medications Prior to Admission medications   Medication Sig Start Date End Date Taking? Authorizing Provider  famotidine (PEPCID) 20 MG tablet Take 1 tablet (20 mg total) by mouth 2 (two) times daily. 06/25/21   Maxwell Caul, PA-C  hydrOXYzine (ATARAX/VISTARIL) 25 MG tablet Take 1 tablet (25 mg total) by mouth every 6 (six) hours as needed for anxiety. 06/25/21   Maxwell Caul, PA-C  metroNIDAZOLE (FLAGYL) 500 MG tablet Take 1 tablet (500 mg total) by mouth 2 (two) times  daily. One po bid x 7 days 10/09/20   Pollyann Savoy, MD  fluticasone [facility]) 50 MCG/ACT nasal spray Place 2 sprays into both nostrils daily. 08/12/20 10/09/20  Henderly, Britni A, PA-C  ranitidine (ZANTAC) 150 MG tablet Take 1 tablet (150 mg total) by mouth 2 (two) times daily. 01/01/18 07/26/19  Rasch, Harolyn Rutherford, NP    Allergies    Patient has no known allergies.  Review of Systems   Review of Systems  Constitutional:  Positive for fatigue.  HENT:  Positive for congestion. Negative for sinus pain and sneezing.   Respiratory:  Positive for cough and shortness of breath. Negative for wheezing.   Cardiovascular:  Positive for chest pain.  Musculoskeletal:  Positive for arthralgias. Negative for myalgias and neck pain.  Neurological:  Negative for headaches.   Physical Exam Updated Vital Signs  ED Triage Vitals  Enc Vitals Group     BP 09/20/21 1946 (!) 124/102     Pulse Rate 09/20/21 1946 82     Resp 09/20/21 1946 16  Temp 09/20/21 1946 98.7 F (37.1 C)     Temp src --      SpO2 09/20/21 1946 100 %     Weight 09/20/21 1941 175 lb (79.4 kg)     Height 09/20/21 1941 5\' 5"  (1.651 m)     Head Circumference --      Peak Flow --      Pain Score 09/20/21 1941 8     Pain Loc --      Pain Edu? --      Excl. in GC? --     Physical Exam Vitals and nursing note reviewed.  Constitutional:      General: She is not in acute distress.    Appearance: She is well-developed. She is not ill-appearing.  HENT:     Head: Normocephalic and atraumatic.  Eyes:     Extraocular Movements: Extraocular movements intact.     Conjunctiva/sclera: Conjunctivae normal.     Pupils: Pupils are equal, round, and reactive to light.  Cardiovascular:     Rate and Rhythm: Normal rate and regular rhythm.     Heart sounds: Normal heart sounds. No murmur heard. Pulmonary:     Effort: Pulmonary effort is normal. No respiratory distress.     Breath sounds: Normal breath sounds.  Abdominal:      Palpations: Abdomen is soft.     Tenderness: There is no abdominal tenderness.  Musculoskeletal:        General: Normal range of motion.     Cervical back: Normal range of motion and neck supple.  Skin:    General: Skin is warm and dry.  Neurological:     Mental Status: She is alert.    ED Results / Procedures / Treatments   Labs (all labs ordered are listed, but only abnormal results are displayed) Labs Reviewed  RESP PANEL BY RT-PCR (FLU A&B, COVID) ARPGX2 - Abnormal; Notable for the following components:      Result Value   SARS Coronavirus 2 by RT PCR POSITIVE (*)    All other components within normal limits    EKG EKG Interpretation  Date/Time:  Monday September 20 2021 19:43:44 EDT Ventricular Rate:  77 PR Interval:  162 QRS Duration: 84 QT Interval:  382 QTC Calculation: 432 R Axis:   -14 Text Interpretation: Normal sinus rhythm Normal ECG Confirmed by 07-07-2005 ([id number]) on 09/20/2021 8:26:48 PM  Radiology DG Chest 2 View  Result Date: 09/20/2021 CLINICAL DATA:  Shortness of breath, chest pain EXAM: CHEST - 2 VIEW COMPARISON:  06/24/2021 FINDINGS: The heart size and mediastinal contours are within normal limits. Both lungs are clear. The visualized skeletal structures are unremarkable. IMPRESSION: No acute abnormality of the lungs. Electronically Signed   By: 06/26/2021 M.D.   On: 09/20/2021 20:01    Procedures Procedures   Medications Ordered in ED Medications  albuterol (VENTOLIN HFA) 108 (90 Base) MCG/ACT inhaler 2 puff (has no administration in time range)    ED Course  I have reviewed the triage vital signs and the nursing notes.  Pertinent labs & imaging results that were available during my care of the patient were reviewed by me and considered in my medical decision making (see chart for details).    MDM Rules/Calculators/A&P                           [name] is here for URI symptoms.  Positive for COVID.  Negative  for flu.  Chest  x-ray unremarkable.  EKG shows sinus rhythm.  No ischemic changes.  No concern for ACS or PE.  She appears very well.  Overall suspect symptoms secondary to COVID.  Given reassurance and discharged from the ED in good condition.  Normal vitals.  No fever.  This chart was dictated using voice recognition software.  Despite best efforts to proofread,  errors can occur which can change the documentation meaning.   Final Clinical Impression(s) / ED Diagnoses Final diagnoses:  COVID-19    Rx / DC Orders ED Discharge Orders     None        Virgina Norfolk, DO 09/20/21 2217

## 2021-09-20 NOTE — ED Triage Notes (Signed)
Patient presents to the ER for chest pain and ShOB. Patient reports the pain is potentially related to a cold. Patient reports she is starting to feel more pain in her chest. States she has been trying to cough up the phlegm and is having increased tightness in her chest.

## 2022-04-21 IMAGING — DX DG CHEST 2V
2 series · 2 of 2 positions shown · non-contrast
Comparison: 06/24/2021

CLINICAL DATA: Shortness of breath, chest pain

EXAM:
CHEST - 2 VIEW

[chest pa]
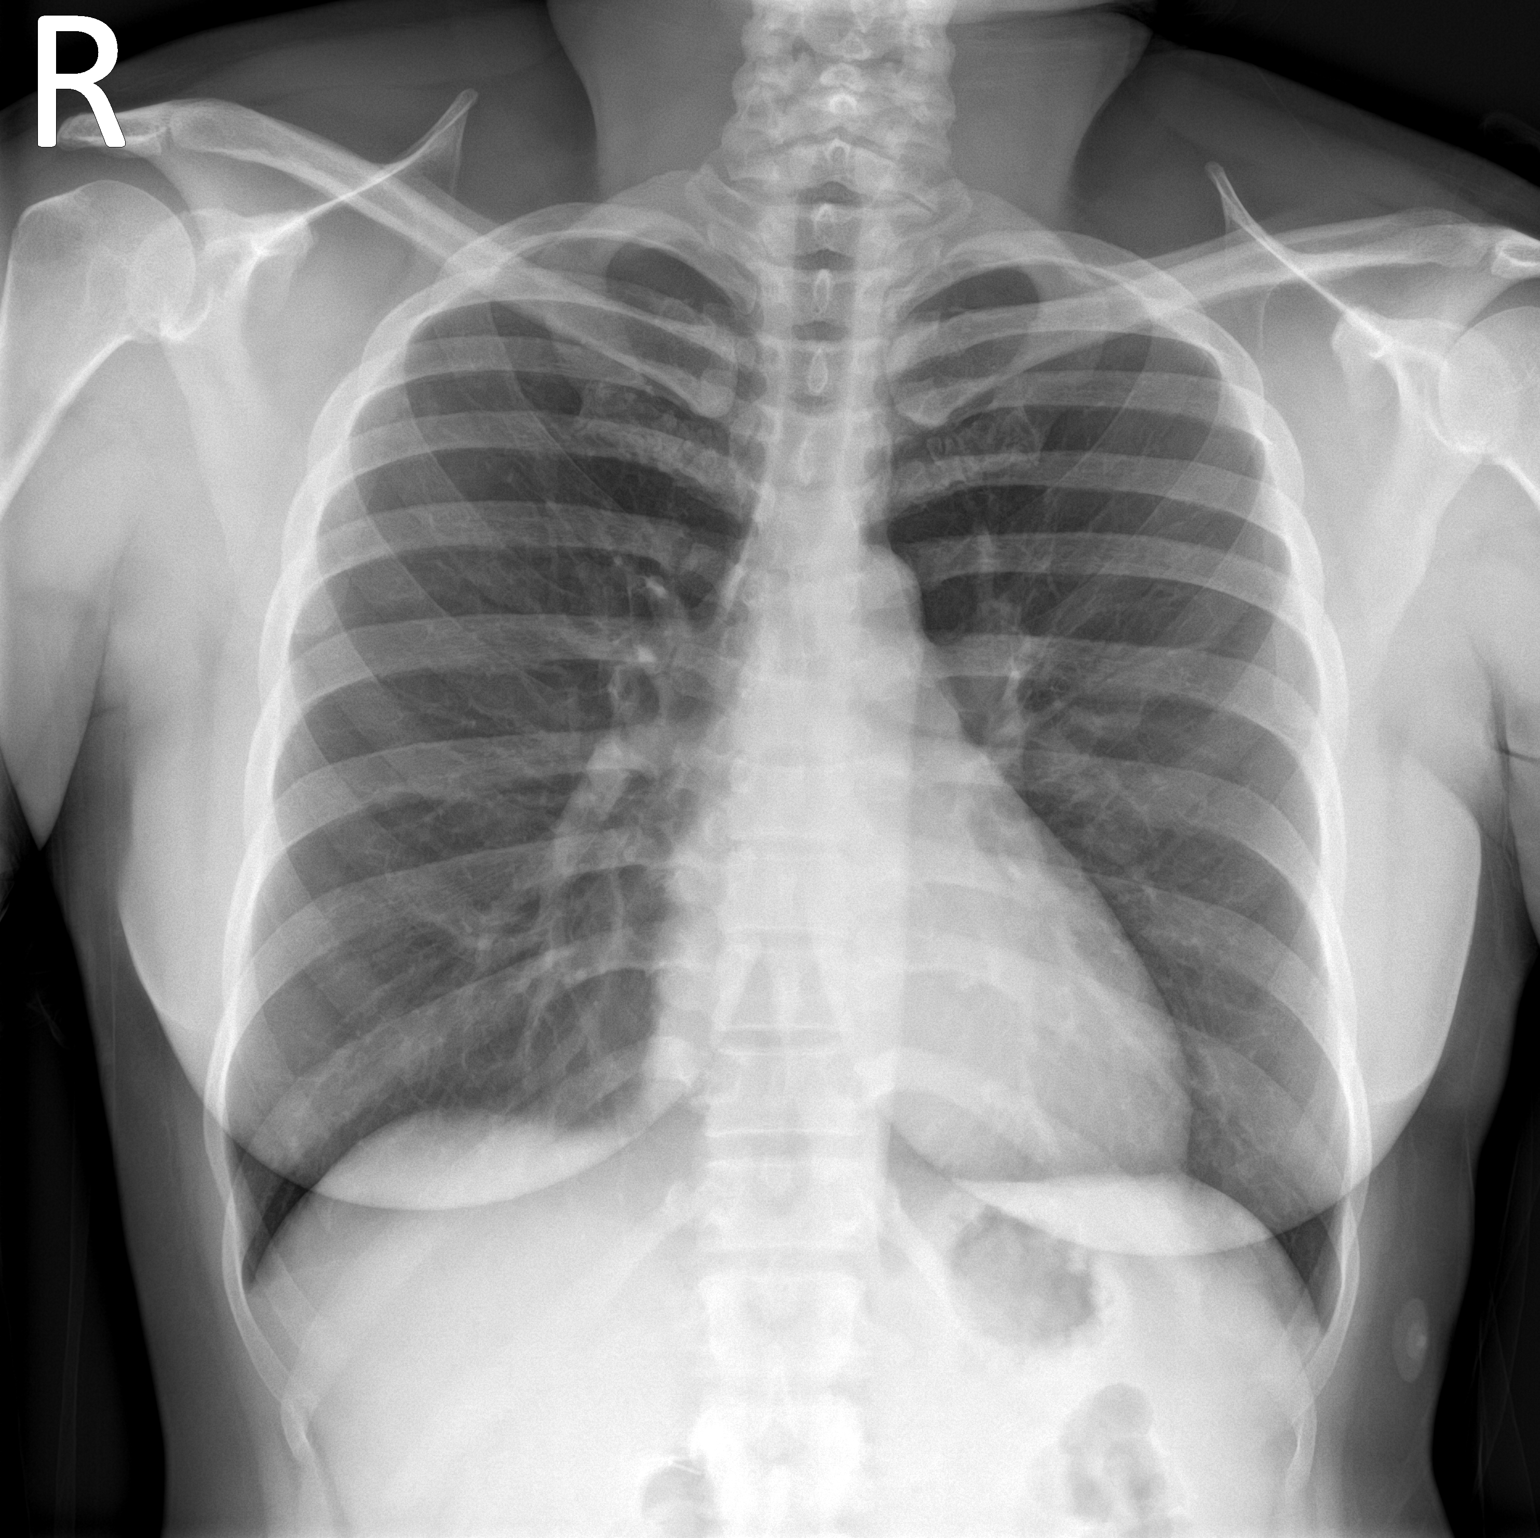

[chest lat]
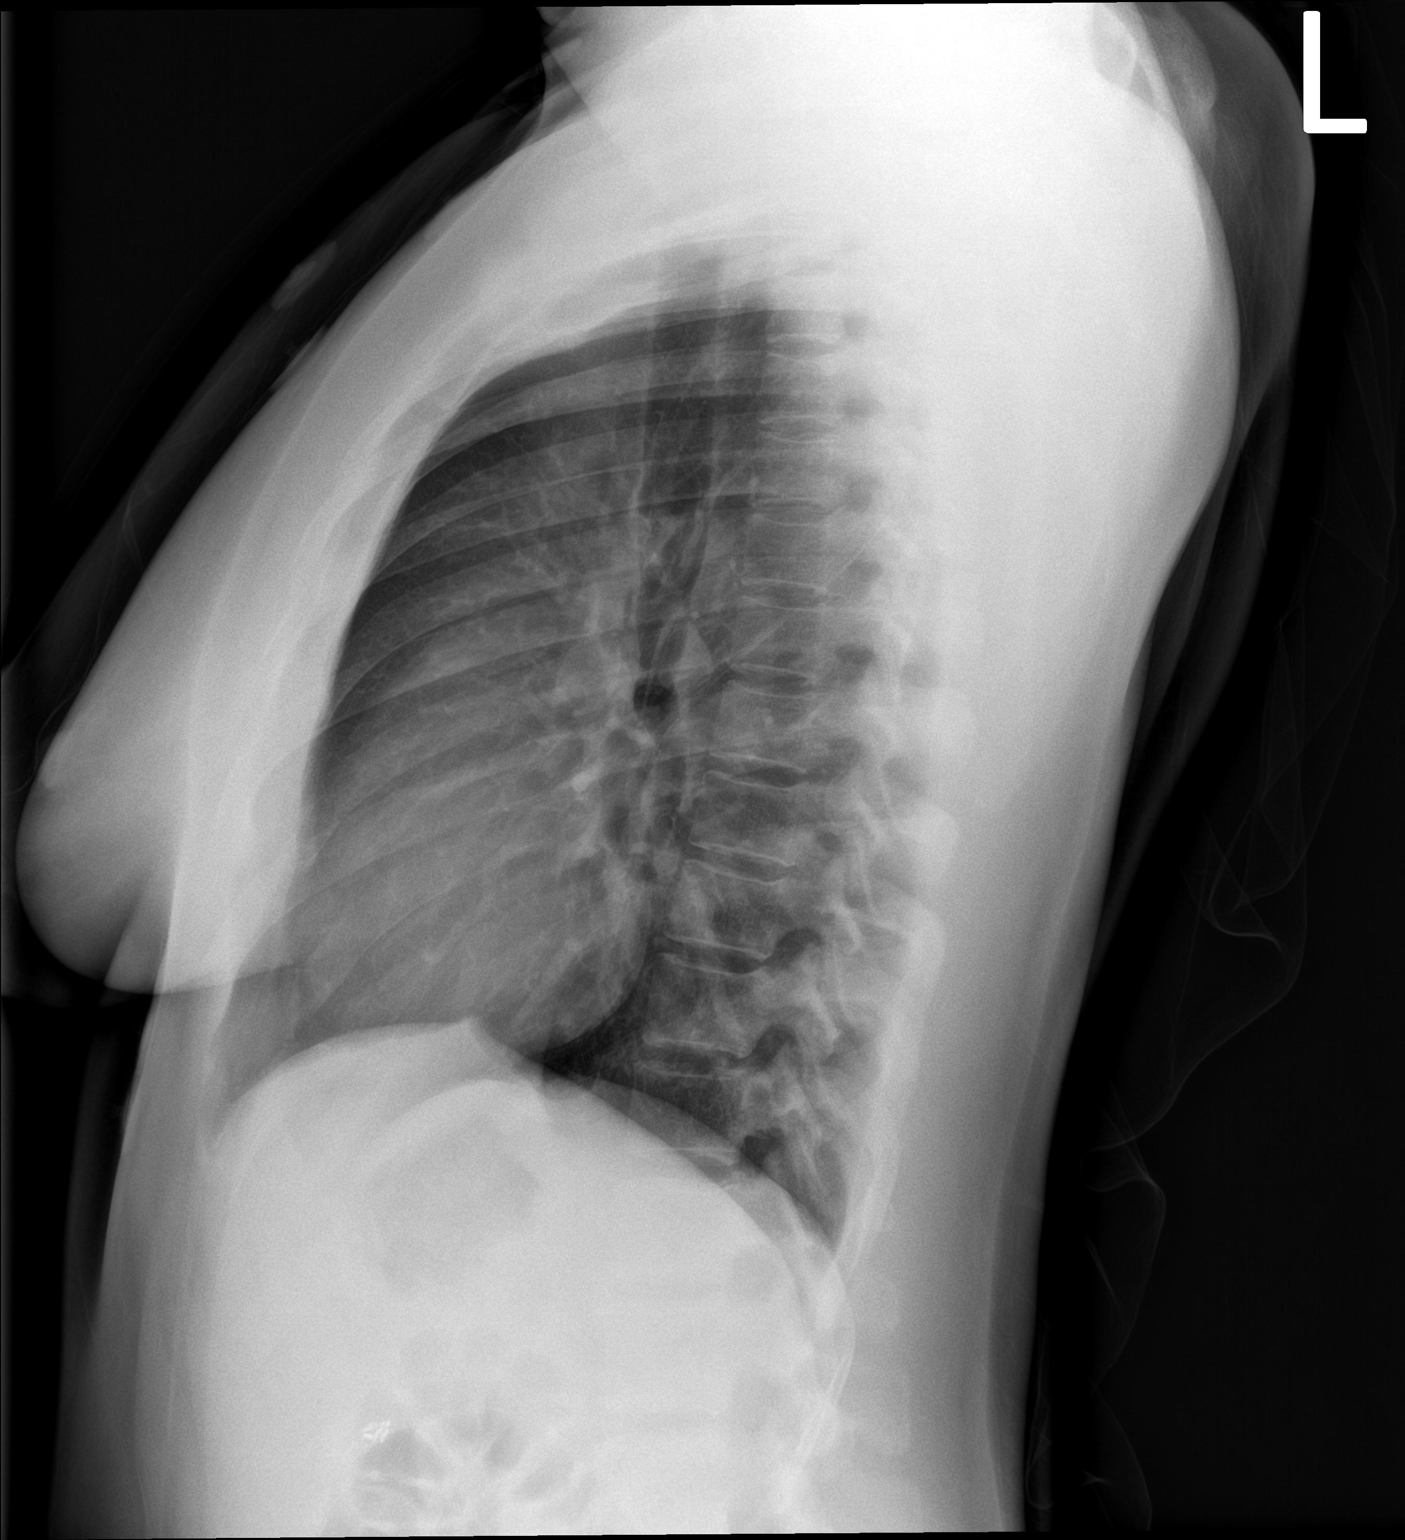

[2 of 2 positions shown; findings below may reference images not displayed]

FINDINGS: The heart size and mediastinal contours are within normal limits.
Both lungs are clear. The visualized skeletal structures are
unremarkable.
IMPRESSION: No acute abnormality of the lungs.

## 2022-07-20 ENCOUNTER — Emergency Department (HOSPITAL_BASED_OUTPATIENT_CLINIC_OR_DEPARTMENT_OTHER)
Admission: EM | Admit: 2022-07-20 | Discharge: 2022-07-20 | Disposition: A | Discharge status: Home / Self Care | Payer: 59 | Attending: Emergency Medicine | Admitting: Emergency Medicine

## 2022-07-20 ENCOUNTER — Other Ambulatory Visit: Payer: Self-pay

## 2022-07-20 ENCOUNTER — Encounter (HOSPITAL_BASED_OUTPATIENT_CLINIC_OR_DEPARTMENT_OTHER): Payer: Self-pay

## 2022-07-20 DIAGNOSIS — N760 Acute vaginitis: Secondary | ICD-10-CM | POA: Insufficient documentation

## 2022-07-20 DIAGNOSIS — Z202 Contact with and (suspected) exposure to infections with a predominantly sexual mode of transmission: Secondary | ICD-10-CM | POA: Insufficient documentation

## 2022-07-20 DIAGNOSIS — R102 Pelvic and perineal pain: Secondary | ICD-10-CM | POA: Diagnosis present

## 2022-07-20 DIAGNOSIS — Z711 Person with feared health complaint in whom no diagnosis is made: Secondary | ICD-10-CM

## 2022-07-20 DIAGNOSIS — B9689 Other specified bacterial agents as the cause of diseases classified elsewhere: Secondary | ICD-10-CM | POA: Insufficient documentation

## 2022-07-20 LAB — WET PREP, GENITAL
Sperm: NONE SEEN
Trich, Wet Prep: NONE SEEN
WBC, Wet Prep HPF POC: >=10 — AB (ref <10)
Yeast Wet Prep HPF POC: NONE SEEN

## 2022-07-20 LAB — URINALYSIS, MICROSCOPIC (REFLEX)

## 2022-07-20 LAB — URINALYSIS, ROUTINE W REFLEX MICROSCOPIC
Bilirubin Urine: NEGATIVE
Glucose, UA: NEGATIVE mg/dL
Ketones, ur: NEGATIVE mg/dL
Leukocytes,Ua: NEGATIVE
Nitrite: NEGATIVE
Protein, ur: NEGATIVE mg/dL
Specific Gravity, Urine: >=1.03 (ref 1.005–1.030)
pH: 5 (ref 5.0–8.0)

## 2022-07-20 LAB — PREGNANCY, URINE: Preg Test, Ur: NEGATIVE

## 2022-07-20 MED ORDER — METRONIDAZOLE 500 MG PO TABS: 500.0000 mg | ORAL_TABLET | Freq: Two times a day (BID) | ORAL | 0 refills | Status: DC

## 2022-07-20 MED ORDER — LIDOCAINE HCL (PF) 1 % IJ SOLN
INTRAMUSCULAR | Status: AC
  Administered 2022-07-20: 1 mL
  Filled 2022-07-20: qty 5

## 2022-07-20 MED ORDER — DOXYCYCLINE HYCLATE 100 MG PO CAPS: 100.0000 mg | ORAL_CAPSULE | Freq: Two times a day (BID) | ORAL | 0 refills | Status: AC

## 2022-07-20 MED ORDER — CEFTRIAXONE SODIUM 500 MG IJ SOLR
500.0000 mg | Freq: Once | INTRAMUSCULAR | Status: AC
  Administered 2022-07-20: 500 mg via INTRAMUSCULAR
  Filled 2022-07-20: qty 500

## 2022-07-20 NOTE — ED Provider Notes (Signed)
[facility] EMERGENCY DEPARTMENT Provider Note   CSN: [id number] Arrival date & time: 07/20/22  1705     History  Chief Complaint  Patient presents with   Pelvic Pain   Vaginal Discharge    Rebecca Bradley is a 41 y.o. female.  Patient with history of bacterial vaginosis and cholecystectomy resents today with complaints of pelvic pain and vaginal discharge. She states that she has had an IUD in place for the past 4 years and last month had a period that was with slightly more than normal bleeding and with increasing pelvic pain. She states her pain went away when her menstrual cycle ended but has since returned and has been intermittent in nature and located in her low pelvic area. States that she has also had 2 weeks of increasingly foul odor and discharge from her vagina. She states that she has felt similarly in the past and has been diagnosed with BV. States that she is sexually active.  The history is provided by the patient. No language interpreter was used.  Pelvic Pain  Vaginal Discharge      Home Medications Prior to Admission medications   Medication Sig Start Date End Date Taking? Authorizing Provider  famotidine (PEPCID) 20 MG tablet Take 1 tablet (20 mg total) by mouth 2 (two) times daily. 06/25/21   Maxwell Caul, PA-C  hydrOXYzine (ATARAX/VISTARIL) 25 MG tablet Take 1 tablet (25 mg total) by mouth every 6 (six) hours as needed for anxiety. 06/25/21   Maxwell Caul, PA-C  metroNIDAZOLE (FLAGYL) 500 MG tablet Take 1 tablet (500 mg total) by mouth 2 (two) times daily. One po bid x 7 days 10/09/20   Pollyann Savoy, MD  fluticasone [facility]) 50 MCG/ACT nasal spray Place 2 sprays into both nostrils daily. 08/12/20 10/09/20  Henderly, Britni A, PA-C  ranitidine (ZANTAC) 150 MG tablet Take 1 tablet (150 mg total) by mouth 2 (two) times daily. 01/01/18 07/26/19  Rasch, Harolyn Rutherford, NP      Allergies    Patient has no known allergies.    Review of Systems    Review of Systems  Genitourinary:  Positive for pelvic pain and vaginal discharge.  All other systems reviewed and are negative.   Physical Exam Updated Vital Signs BP (!) 140/91 (BP Location: Right Arm)   Pulse 70   Temp 98.6 F (37 C)   Resp 18   Ht 5\' 5"  (1.651 m)   Wt 85.7 kg   LMP 06/20/2022 (Exact Date)   SpO2 100%   BMI 31.45 kg/m  Physical Exam Vitals and nursing note reviewed. Exam conducted with a chaperone present.  Constitutional:      General: She is not in acute distress.    Appearance: Normal appearance. She is normal weight. She is not ill-appearing, toxic-appearing or diaphoretic.  HENT:     Head: Normocephalic and atraumatic.  Cardiovascular:     Rate and Rhythm: Normal rate.  Pulmonary:     Effort: Pulmonary effort is normal. No respiratory distress.  Abdominal:     General: Abdomen is flat.     Palpations: Abdomen is soft.     Tenderness: There is no abdominal tenderness.     Comments: No tenderness to palpation of the abdomen or pelvis  Genitourinary:    Comments: White foul smelling discharge present in the vagina. IUD strings visualized. No CMT or adnexal tenderness to palpation.  Musculoskeletal:        General: Normal range of  motion.     Cervical back: Normal range of motion.  Skin:    General: Skin is warm and dry.  Neurological:     General: No focal deficit present.     Mental Status: She is alert.  Psychiatric:        Mood and Affect: Mood normal.        Behavior: Behavior normal.     ED Results / Procedures / Treatments   Labs (all labs ordered are listed, but only abnormal results are displayed) Labs Reviewed  WET PREP, GENITAL - Abnormal; Notable for the following components:      Result Value   Clue Cells Wet Prep HPF POC PRESENT (*)    WBC, Wet Prep HPF POC >=10 (*)    All other components within normal limits  URINALYSIS, ROUTINE W REFLEX MICROSCOPIC - Abnormal; Notable for the following components:   Hgb urine dipstick  TRACE (*)    All other components within normal limits  URINALYSIS, MICROSCOPIC (REFLEX) - Abnormal; Notable for the following components:   Bacteria, UA MANY (*)    All other components within normal limits  PREGNANCY, URINE  GC/CHLAMYDIA PROBE AMP ([place]) NOT AT [facility]    EKG None  Radiology No results found.  Procedures Procedures    Medications Ordered in ED Medications - No data to display  ED Course/ Medical Decision Making/ A&P                           Medical Decision Making Amount and/or Complexity of Data Reviewed Labs: ordered.   Patient presents today with concerns for pelvic pain and vaginal discharge. She is afebrile, non-toxic appearing, and in no acute distress with reassuring vital signs. Physical exam reveals abdomen that is soft and non-tender. Pelvic exam reveals white frothy discharge, IUD strings visualized. No CMT or adnexal tenderness. Given unable to reproduce patients pain and she states she currently does not have have any pain, do not feel that further evaluation with labs and imaging is indicated at this time. Low suspicion for displaced IUD, ovarian torsion, appendicitis, or any other emergent condition. She also states that she feels as she has when she had BV previously. Wet prep with clue cells present. Will treat with Flagyl for same. Pt has been advised to not drink alcohol while on this medication. Also tested for GC/Chlamydia which is pending. Pt understands that they have GC/Chlamydia cultures pending and that they will need to inform all sexual partners if results return positive. Pt has been treated prophylactically with doxycycline and Rocephin due to pts history, pelvic exam, and wet prep with increased WBCs.  Pt not concerning for PID because hemodynamically stable and no cervical motion tenderness on pelvic exam.    Patient to be discharged with instructions to follow up with OBGYN/PCP. Discussed importance of using protection when  sexually active.  Patient is understanding and amenable to plan, educated on red flag symptoms that would prompt immediate return.  Discharged in stable condition.   Final Clinical Impression(s) / ED Diagnoses Final diagnoses:  BV (bacterial vaginosis)  Concern about STD in female without diagnosis    Rx / DC Orders ED Discharge Orders          Ordered    doxycycline (VIBRAMYCIN) 100 MG capsule  2 times daily        07/20/22 2054    metroNIDAZOLE (FLAGYL) 500 MG tablet  2 times daily  07/20/22 2054          An After Visit Summary was printed and given to the patient.     Vear Clock 07/20/22 2056    Glynn Octave, MD 07/20/22 [id number]

## 2022-07-20 NOTE — ED Notes (Signed)
D/c paperwork reviewed with pt, including prescriptions. Pt with no questions or concerns at time of d/c. Pt ambulatory to ED exit without assistance.

## 2022-07-20 NOTE — Discharge Instructions (Signed)
As we discussed, your pelvic exam reveals that you do have bacterial vaginosis.  I have given you a prescription for an antibiotic Flagyl for you to take for management of this.  Please do not drink alcohol while taking this medication.  Additionally, your gonorrhea and Chlamydia swabs are pending and should result in the next 24 to 48 hours.  You will need to monitor your MyChart for these results.  I have went ahead and treated you for both of these conditions and given you a antibiotic to take as prescribed for management.  If these test results positive you will need to inform all sexual partners of your positive test result and abstain from sexual intercourse until you have followed up with your primary doctor for a test of cure.  Return if development of any new or worsening symptoms.

## 2022-07-20 NOTE — ED Triage Notes (Signed)
C/o pelvic pain x 1 month, watery white discharge with odor x 2 weeks. States has an IUD. States last month had a "real bad menstrual cycle".   Denies concern for STD, hx of BV

## 2022-07-21 LAB — GC/CHLAMYDIA PROBE AMP (~~LOC~~) NOT AT ARMC
Chlamydia: NEGATIVE
Comment: NEGATIVE
Comment: NORMAL
Neisseria Gonorrhea: NEGATIVE

## 2023-04-03 ENCOUNTER — Encounter (HOSPITAL_BASED_OUTPATIENT_CLINIC_OR_DEPARTMENT_OTHER): Payer: Self-pay | Admitting: Urology

## 2023-04-03 ENCOUNTER — Emergency Department (HOSPITAL_BASED_OUTPATIENT_CLINIC_OR_DEPARTMENT_OTHER)
Admission: EM | Admit: 2023-04-03 | Discharge: 2023-04-03 | Disposition: A | Discharge status: Home / Self Care | Payer: 59 | Attending: Emergency Medicine | Admitting: Emergency Medicine

## 2023-04-03 ENCOUNTER — Other Ambulatory Visit: Payer: Self-pay

## 2023-04-03 DIAGNOSIS — J020 Streptococcal pharyngitis: Secondary | ICD-10-CM | POA: Diagnosis not present

## 2023-04-03 DIAGNOSIS — J029 Acute pharyngitis, unspecified: Secondary | ICD-10-CM | POA: Diagnosis present

## 2023-04-03 LAB — GROUP A STREP BY PCR: Group A Strep by PCR: DETECTED — AB

## 2023-04-03 MED ORDER — PENICILLIN G BENZATHINE 1200000 UNIT/2ML IM SUSY
1.2000 10*6.[IU] | PREFILLED_SYRINGE | Freq: Once | INTRAMUSCULAR | Status: AC
  Administered 2023-04-03: 1.2 10*6.[IU] via INTRAMUSCULAR
  Filled 2023-04-03: qty 2

## 2023-04-03 MED ORDER — DEXAMETHASONE SODIUM PHOSPHATE 10 MG/ML IJ SOLN: 10.0000 mg | Freq: Once | INTRAMUSCULAR | Status: DC

## 2023-04-03 NOTE — Discharge Instructions (Addendum)
It was a pleasure taking care of you today. Strep throat swab resulted positive. You have received a antibiotic injection during your ED visit to treat strep throat. Sore throat should start resolving within the next 2-3 days. Seek emergency care if experiencing any new or worsening symptoms.

## 2023-04-03 NOTE — ED Provider Notes (Signed)
[place] [facility] AT [facility] Provider Note   CSN: [id number] Arrival date & time: 04/03/23  1345     History  Chief Complaint  Patient presents with   Sore Throat    Rebecca Bradley is a 42 y.o. female who presents to ED complaining of sore throat x3days. Patient endorses white patches on tonsils and tonsillar swelling. Patient is able to tolerate oral fluids. Denies fever, chills, fatigue, headache, neck stiffness/pain, cough, dyspnea, chest pain, abdominal pain, nausea, vomiting, diarrhea.   Sore Throat       Home Medications Prior to Admission medications   Medication Sig Start Date End Date Taking? Authorizing Provider  doxycycline (VIBRAMYCIN) 100 MG capsule Take 1 capsule (100 mg total) by mouth 2 (two) times daily. 07/20/22   Smoot, Shawn Route, PA-C  famotidine (PEPCID) 20 MG tablet Take 1 tablet (20 mg total) by mouth 2 (two) times daily. 06/25/21   Maxwell Caul, PA-C  hydrOXYzine (ATARAX/VISTARIL) 25 MG tablet Take 1 tablet (25 mg total) by mouth every 6 (six) hours as needed for anxiety. 06/25/21   Maxwell Caul, PA-C  metroNIDAZOLE (FLAGYL) 500 MG tablet Take 1 tablet (500 mg total) by mouth 2 (two) times daily. 07/20/22   Smoot, Sarah A, PA-C  fluticasone (FLONASE) 50 MCG/ACT nasal spray Place 2 sprays into both nostrils daily. 08/12/20 10/09/20  Henderly, Britni A, PA-C  ranitidine (ZANTAC) 150 MG tablet Take 1 tablet (150 mg total) by mouth 2 (two) times daily. 01/01/18 07/26/19  Rasch, Harolyn Rutherford, NP      Allergies    Patient has no known allergies.    Review of Systems   Review of Systems  HENT:  Positive for sore throat.     Physical Exam Updated Vital Signs BP (!) 129/91 (BP Location: Left Arm)   Pulse 87   Temp 98 F (36.7 C)   Resp 18   Ht  (1.651 m)   Wt 85.7 kg   SpO2 100%   BMI 31.44 kg/m  Physical Exam Vitals and nursing note reviewed.  Constitutional:      General: She is not in acute distress. HENT:      Head: Normocephalic and atraumatic.     Mouth/Throat:     Mouth: Mucous membranes are moist.     Pharynx: Oropharynx is clear. Uvula midline. No posterior oropharyngeal erythema.     Tonsils: Tonsillar exudate present. No tonsillar abscesses. 2+ on the right. 2+ on the left.  Eyes:     General: No scleral icterus.       Right eye: No discharge.        Left eye: No discharge.     Conjunctiva/sclera: Conjunctivae normal.  Cardiovascular:     Rate and Rhythm: Normal rate and regular rhythm.     Pulses: Normal pulses.     Heart sounds: Normal heart sounds. No murmur heard. Pulmonary:     Effort: Pulmonary effort is normal. No respiratory distress.     Breath sounds: Normal breath sounds. No wheezing, rhonchi or rales.  Abdominal:     Palpations: Abdomen is soft. There is no mass.     Tenderness: There is no abdominal tenderness.  Musculoskeletal:     Cervical back: Normal range of motion and neck supple.     Right lower leg: No edema.     Left lower leg: No edema.  Lymphadenopathy:     Cervical: No cervical adenopathy.  Skin:    General:  Skin is warm and dry.     Findings: No rash.  Neurological:     General: No focal deficit present.     Mental Status: She is alert. Mental status is at baseline.  Psychiatric:        Mood and Affect: Mood normal.     ED Results / Procedures / Treatments   Labs (all labs ordered are listed, but only abnormal results are displayed) Labs Reviewed  GROUP A STREP BY PCR - Abnormal; Notable for the following components:      Result Value   Group A Strep by PCR DETECTED (*)    All other components within normal limits    EKG None  Radiology No results found.  Procedures Procedures    Medications Ordered in ED Medications  penicillin g benzathine (BICILLIN LA) 1200000 UNIT/2ML injection 1.2 Million Units (has no administration in time range)    ED Course/ Medical Decision Making/ A&P                             Medical Decision  Making   This patient presents to the ED for concern of sore throat x3 days, this involves an extensive number of treatment options, and is a complaint that carries with it a high risk of complications and morbidity.  The differential diagnosis includes Flu/COVID/RSV, strep pharyngitis, peritonsillar abscess, retropharyngeal abscess, meningitis.   Co morbidities that complicate the patient evaluation  none   Additional history obtained:  none   Lab Tests:  I Ordered, and personally interpreted labs.  The pertinent results include:   Strep POCT: positive   Problem List / ED Course / Critical interventions / Medication management  Patient presented to ED complaining of sore throat x3days. Strep PCR positive. Physical exam unremarkable besides tonsillar erythema and exudates. Bicillin injection given in ED.  Patient was given return precautions and is stable for discharge at this time. Patient verbalized understanding of plan.   DDx: These are considered less likely due to history of present illness and physical exam findings -Peritonsillar/Retropharyngeal abscess: Patient denies dysphagia, dysphonia, physical exam reassuring -Meningitis: patient's symptoms, vital signs, physical exam findings including lack of meningismus seem grossly less consistent at this time -Flu/COVID/RSV: no history of cough, fever, fatigue -mono: no fatigue, fever, abdominal tenderness. Physical exam reassuring.  Social Determinants of Health:  none          Final Clinical Impression(s) / ED Diagnoses Final diagnoses:  Strep pharyngitis    Rx / DC Orders ED Discharge Orders     None         Dorthy Cooler, [place] 04/03/23 1556    Tegeler, Canary Brim, MD 04/03/23 [phone]

## 2023-04-03 NOTE — ED Triage Notes (Addendum)
Pt states sore throat that started Saturday and is getting worse  States white patches noticed and throat feels swollen  Tonsils red and inflamed  H/o tonsil stones

## 2023-06-29 ENCOUNTER — Other Ambulatory Visit: Payer: Self-pay

## 2023-06-29 ENCOUNTER — Emergency Department (HOSPITAL_BASED_OUTPATIENT_CLINIC_OR_DEPARTMENT_OTHER)
Admission: EM | Admit: 2023-06-29 | Discharge: 2023-06-29 | Disposition: A | Discharge status: Home / Self Care | Payer: 59 | Attending: Emergency Medicine | Admitting: Emergency Medicine

## 2023-06-29 ENCOUNTER — Encounter (HOSPITAL_BASED_OUTPATIENT_CLINIC_OR_DEPARTMENT_OTHER): Payer: Self-pay

## 2023-06-29 DIAGNOSIS — Z1152 Encounter for screening for COVID-19: Secondary | ICD-10-CM | POA: Insufficient documentation

## 2023-06-29 DIAGNOSIS — R110 Nausea: Secondary | ICD-10-CM | POA: Diagnosis present

## 2023-06-29 DIAGNOSIS — B349 Viral infection, unspecified: Secondary | ICD-10-CM | POA: Diagnosis not present

## 2023-06-29 LAB — BASIC METABOLIC PANEL
Anion gap: 9 (ref 5–15)
BUN: 8 mg/dL (ref 6–20)
CO2: 23 mmol/L (ref 22–32)
Calcium: 9.2 mg/dL (ref 8.9–10.3)
Chloride: 105 mmol/L (ref 98–111)
Creatinine, Ser: 0.84 mg/dL (ref 0.44–1.00)
GFR, Estimated: >60 mL/min (ref >60)
Glucose, Bld: 104 mg/dL — ABNORMAL HIGH (ref 70–99)
Potassium: 3.7 mmol/L (ref 3.5–5.1)
Sodium: 137 mmol/L (ref 135–145)

## 2023-06-29 LAB — CBC
HCT: 32 % — ABNORMAL LOW (ref 36.0–46.0)
Hemoglobin: 10 g/dL — ABNORMAL LOW (ref 12.0–15.0)
MCH: 23.4 pg — ABNORMAL LOW (ref 26.0–34.0)
MCHC: 31.3 g/dL (ref 30.0–36.0)
MCV: 74.8 fL — ABNORMAL LOW (ref 80.0–100.0)
Platelets: 296 10*3/uL (ref 150–400)
RBC: 4.28 MIL/uL (ref 3.87–5.11)
RDW: 17.3 % — ABNORMAL HIGH (ref 11.5–15.5)
WBC: 4.6 10*3/uL (ref 4.0–10.5)
nRBC: 0 % (ref 0.0–0.2)

## 2023-06-29 LAB — RESP PANEL BY RT-PCR (RSV, FLU A&B, COVID)  RVPGX2
Influenza A by PCR: NEGATIVE
Influenza B by PCR: NEGATIVE
Resp Syncytial Virus by PCR: NEGATIVE
SARS Coronavirus 2 by RT PCR: NEGATIVE

## 2023-06-29 MED ORDER — ONDANSETRON 4 MG PO TBDP
8.0000 mg | ORAL_TABLET | Freq: Once | ORAL | Status: AC
  Administered 2023-06-29: 8 mg via ORAL
  Filled 2023-06-29: qty 2

## 2023-06-29 MED ORDER — ONDANSETRON HCL 4 MG PO TABS
4.0000 mg | ORAL_TABLET | Freq: Four times a day (QID) | ORAL | 0 refills | Status: AC | PRN
Start: 2023-06-29 — End: ?

## 2023-06-29 NOTE — ED Triage Notes (Signed)
Pt states she has been having nausea for the past few days, headache and fatigue. Has taken covid tests x3 and negative.

## 2023-06-29 NOTE — Discharge Instructions (Addendum)
It was a pleasure taking part in your care today.  As we discussed, I believe that you have a viral illness due to the symptoms you are currently experiencing.  Your testing here was negative for COVID, flu.  Your lab work did show that your hemoglobin was slightly decreased from the last set of labs we have 2 years ago so please follow-up with your PCP in the next 7 days to have your labs redrawn to ensure that your hemoglobin is not continuing to decrease.  You may take ibuprofen or Tylenol at home for symptoms secondary to your viral illness.  You may take Zicam which helps shorten duration of symptoms.  You may take Zofran every 6 hours as needed for nausea.  Please continue to push fluids such as water and Pedialyte.  Please eat a high-protein low-fat diet.  Please read the attached guide concerning viral illnesses.

## 2023-06-29 NOTE — ED Provider Notes (Addendum)
[place] [facility] Provider Note   CSN: [id number] Arrival date & time: 06/29/23  0109     History  Chief Complaint  Patient presents with   Nausea    Rebecca Bradley is a 42 y.o. female with medical history anxiety, BV.  Patient presents to the ED for evaluation of headache, nausea and fatigue.  Patient reports that on Sunday night her daughter came home from daycare with a fever.  The patient reports that the next day she developed fatigue, a headache that she describes as a "nagging headache" and nausea.  The patient denies any fevers, sore throat, nasal congestion, chest pain, shortness of breath, vomiting, abdominal pain, diarrhea.  The patient reports that on Monday the patient's daughter's daycare advised her that the patient's daughter's teacher was out sick as well.  The patient reports that she has taken 3 home COVID tests and they were all negative however one of the test had a "faint line".  She denies any medications prior to arrival.  Denies one-sided weakness or numbness, lightheadedness, dizziness, weakness.  Of note, the patient does report that 2 weeks ago she started taking Wegovy for weight loss.  HPI     Home Medications Prior to Admission medications   Medication Sig Start Date End Date Taking? Authorizing Provider  ondansetron (ZOFRAN) 4 MG tablet Take 1 tablet (4 mg total) by mouth every 6 (six) hours as needed for nausea or vomiting. 06/29/23  Yes Al Decant, PA-C  doxycycline (VIBRAMYCIN) 100 MG capsule Take 1 capsule (100 mg total) by mouth 2 (two) times daily. 07/20/22   Smoot, Shawn Route, PA-C  famotidine (PEPCID) 20 MG tablet Take 1 tablet (20 mg total) by mouth 2 (two) times daily. 06/25/21   Maxwell Caul, PA-C  hydrOXYzine (ATARAX/VISTARIL) 25 MG tablet Take 1 tablet (25 mg total) by mouth every 6 (six) hours as needed for anxiety. 06/25/21   Maxwell Caul, PA-C  metroNIDAZOLE (FLAGYL) 500 MG tablet Take 1  tablet (500 mg total) by mouth 2 (two) times daily. 07/20/22   Smoot, Sarah A, PA-C  fluticasone (FLONASE) 50 MCG/ACT nasal spray Place 2 sprays into both nostrils daily. 08/12/20 10/09/20  Henderly, Britni A, PA-C  ranitidine (ZANTAC) 150 MG tablet Take 1 tablet (150 mg total) by mouth 2 (two) times daily. 01/01/18 07/26/19  Rasch, Harolyn Rutherford, NP      Allergies    Patient has no known allergies.    Review of Systems   Review of Systems  Constitutional:  Positive for fatigue. Negative for fever.  HENT:  Negative for congestion and sore throat.   Respiratory:  Negative for cough and shortness of breath.   Cardiovascular:  Negative for chest pain.  Gastrointestinal:  Positive for nausea. Negative for abdominal pain, diarrhea and vomiting.  Neurological:  Positive for headaches.  All other systems reviewed and are negative.   Physical Exam Updated Vital Signs BP (!) 147/91   Pulse 74   Temp 98.7 F (37.1 C) (Oral)   Resp 18   SpO2 93%  Physical Exam Vitals and nursing note reviewed.  Constitutional:      General: She is not in acute distress.    Appearance: Normal appearance. She is not ill-appearing, toxic-appearing or diaphoretic.  HENT:     Head: Normocephalic and atraumatic.     Nose: Nose normal.     Mouth/Throat:     Mouth: Mucous membranes are moist.  Pharynx: Oropharynx is clear. No oropharyngeal exudate or posterior oropharyngeal erythema.     Comments: No posterior oropharynx erythema or exudate Eyes:     Extraocular Movements: Extraocular movements intact.     Conjunctiva/sclera: Conjunctivae normal.     Pupils: Pupils are equal, round, and reactive to light.  Cardiovascular:     Rate and Rhythm: Normal rate and regular rhythm.  Pulmonary:     Effort: Pulmonary effort is normal.     Breath sounds: Normal breath sounds. No wheezing.  Abdominal:     General: Abdomen is flat. Bowel sounds are normal.     Palpations: Abdomen is soft.     Tenderness: There is no  abdominal tenderness.     Comments: All 4 quadrants of abdomen are soft and compressible  Musculoskeletal:     Cervical back: Normal range of motion and neck supple. No tenderness.  Skin:    General: Skin is warm and dry.     Capillary Refill: Capillary refill takes less than 2 seconds.  Neurological:     General: No focal deficit present.     Mental Status: She is alert and oriented to person, place, and time.     GCS: GCS eye subscore is 4. GCS verbal subscore is 5. GCS motor subscore is 6.     Cranial Nerves: Cranial nerves 2-12 are intact. No cranial nerve deficit.     Sensory: Sensation is intact. No sensory deficit.     Motor: Motor function is intact. No weakness.     Coordination: Coordination is intact. Heel to [facility] Test normal.     Comments: Neurological examination reassuring.  CN II through XII intact.  Intact finger-nose, heel-to-shin.  No pronator drift, no slurred speech, no facial droop.     ED Results / Procedures / Treatments   Labs (all labs ordered are listed, but only abnormal results are displayed) Labs Reviewed  CBC - Abnormal; Notable for the following components:      Result Value   Hemoglobin 10.0 (*)    HCT 32.0 (*)    MCV 74.8 (*)    MCH 23.4 (*)    RDW 17.3 (*)    All other components within normal limits  BASIC METABOLIC PANEL - Abnormal; Notable for the following components:   Glucose, Bld 104 (*)    All other components within normal limits  RESP PANEL BY RT-PCR (RSV, FLU A&B, COVID)  [id number]    EKG None  Radiology No results found.  Procedures Procedures   Medications Ordered in ED Medications  ondansetron (ZOFRAN-ODT) disintegrating tablet 8 mg (8 mg Oral Given 06/29/23 1012)    ED Course/ Medical Decision Making/ A&P  Medical Decision Making Amount and/or Complexity of Data Reviewed Labs: ordered.  Risk Prescription drug management.   42 year old female presents to the ED for evaluation.  Please see HPI for further  details.  On examination the patient is afebrile and nontachycardic.  Her lung sounds are clear bilaterally and she is not hypoxic.  Abdomen is soft and compressible throughout.  Neurological examination at baseline.  No focal neurodeficits identified.  Patient had CBC, BMP and viral testing drawn.  The patient CBC shows no leukocytosis.  The patient hemoglobin is decreased to 10, lasting globin was 2 years ago showed 14.  The patient does state that she has had a lot of breakthrough bleeding recently secondary to her Depo-Provera which she has discontinued.  She reports that she had persistent bleeding from March until last  week but states that her bleeding is resolved.  She denies lightheadedness, dizziness or weakness.  Denies shortness of breath.  Metabolic panel shows no electrolyte derangement, no elevated creatinine, no anion gap elevation.  Patient viral panel is negative for all.  The patient was given Zofran and states on reassessment she feels much better, her nausea is decreased.  Patient most likely suffering from viral illness.  Patient was advised to treat symptoms conservatively at home utilizing ibuprofen, Tylenol, Zicam.  She will be sent home with Zofran for nausea.  She was advised to follow-up with her PCP for lab recheck in the next 7 days to ensure that hemoglobin is not continuing to downtrend.  She voiced understanding.  She had all of her questions answered to her satisfaction.  She is stable to discharge home at this time.   Final Clinical Impression(s) / ED Diagnoses Final diagnoses:  Viral illness    Rx / DC Orders ED Discharge Orders          Ordered    ondansetron (ZOFRAN) 4 MG tablet  Every 6 hours PRN        06/29/23 1118                  Al Decant, PA-C 06/29/23 1121    [place], Adam, DO 06/29/23 1423

## 2023-09-26 ENCOUNTER — Emergency Department (HOSPITAL_BASED_OUTPATIENT_CLINIC_OR_DEPARTMENT_OTHER): Payer: 59

## 2023-09-26 ENCOUNTER — Emergency Department (HOSPITAL_BASED_OUTPATIENT_CLINIC_OR_DEPARTMENT_OTHER)
Admission: EM | Admit: 2023-09-26 | Discharge: 2023-09-26 | Disposition: A | Discharge status: Home / Self Care | Payer: 59 | Attending: Emergency Medicine | Admitting: Emergency Medicine

## 2023-09-26 ENCOUNTER — Encounter (HOSPITAL_BASED_OUTPATIENT_CLINIC_OR_DEPARTMENT_OTHER): Payer: Self-pay

## 2023-09-26 DIAGNOSIS — B9689 Other specified bacterial agents as the cause of diseases classified elsewhere: Secondary | ICD-10-CM | POA: Diagnosis not present

## 2023-09-26 DIAGNOSIS — D259 Leiomyoma of uterus, unspecified: Secondary | ICD-10-CM | POA: Diagnosis not present

## 2023-09-26 DIAGNOSIS — N760 Acute vaginitis: Secondary | ICD-10-CM

## 2023-09-26 DIAGNOSIS — R1030 Lower abdominal pain, unspecified: Secondary | ICD-10-CM | POA: Diagnosis present

## 2023-09-26 LAB — CBC WITH DIFFERENTIAL/PLATELET
Abs Immature Granulocytes: 0.01 10*3/uL (ref 0.00–0.07)
Basophils Absolute: 0 10*3/uL (ref 0.0–0.1)
Basophils Relative: 1 %
Eosinophils Absolute: 0.1 10*3/uL (ref 0.0–0.5)
Eosinophils Relative: 3 %
HCT: 32.6 % — ABNORMAL LOW (ref 36.0–46.0)
Hemoglobin: 10 g/dL — ABNORMAL LOW (ref 12.0–15.0)
Immature Granulocytes: 0 %
Lymphocytes Relative: 42 %
Lymphs Abs: 1.4 10*3/uL (ref 0.7–4.0)
MCH: 24.1 pg — ABNORMAL LOW (ref 26.0–34.0)
MCHC: 30.7 g/dL (ref 30.0–36.0)
MCV: 78.6 fL — ABNORMAL LOW (ref 80.0–100.0)
Monocytes Absolute: 0.3 10*3/uL (ref 0.1–1.0)
Monocytes Relative: 8 %
Neutro Abs: 1.5 10*3/uL — ABNORMAL LOW (ref 1.7–7.7)
Neutrophils Relative %: 46 %
Platelets: 276 10*3/uL (ref 150–400)
RBC: 4.15 MIL/uL (ref 3.87–5.11)
RDW: 19.1 % — ABNORMAL HIGH (ref 11.5–15.5)
WBC: 3.3 10*3/uL — ABNORMAL LOW (ref 4.0–10.5)
nRBC: 0 % (ref 0.0–0.2)

## 2023-09-26 LAB — COMPREHENSIVE METABOLIC PANEL
ALT: 15 U/L (ref 0–44)
AST: 17 U/L (ref 15–41)
Albumin: 4.3 g/dL (ref 3.5–5.0)
Alkaline Phosphatase: 61 U/L (ref 38–126)
Anion gap: 11 (ref 5–15)
BUN: 8 mg/dL (ref 6–20)
CO2: 22 mmol/L (ref 22–32)
Calcium: 9.5 mg/dL (ref 8.9–10.3)
Chloride: 105 mmol/L (ref 98–111)
Creatinine, Ser: 0.91 mg/dL (ref 0.44–1.00)
GFR, Estimated: >60 mL/min (ref >60)
Glucose, Bld: 94 mg/dL (ref 70–99)
Potassium: 4.1 mmol/L (ref 3.5–5.1)
Sodium: 138 mmol/L (ref 135–145)
Total Bilirubin: 0.6 mg/dL (ref 0.3–1.2)
Total Protein: 7.3 g/dL (ref 6.5–8.1)

## 2023-09-26 LAB — PREGNANCY, URINE: Preg Test, Ur: NEGATIVE

## 2023-09-26 LAB — URINALYSIS, ROUTINE W REFLEX MICROSCOPIC
Bilirubin Urine: NEGATIVE
Glucose, UA: NEGATIVE mg/dL
Hgb urine dipstick: NEGATIVE
Ketones, ur: NEGATIVE mg/dL
Leukocytes,Ua: NEGATIVE
Nitrite: NEGATIVE
Protein, ur: NEGATIVE mg/dL
Specific Gravity, Urine: 1.015 (ref 1.005–1.030)
pH: 5.5 (ref 5.0–8.0)

## 2023-09-26 LAB — WET PREP, GENITAL
Sperm: NONE SEEN
Trich, Wet Prep: NONE SEEN
WBC, Wet Prep HPF POC: <10 (ref <10)
Yeast Wet Prep HPF POC: NONE SEEN

## 2023-09-26 LAB — LIPASE, BLOOD: Lipase: 27 U/L (ref 11–51)

## 2023-09-26 MED ORDER — ACETAMINOPHEN 325 MG PO TABS
650.0000 mg | ORAL_TABLET | Freq: Once | ORAL | Status: AC
  Administered 2023-09-26: 650 mg via ORAL
  Filled 2023-09-26: qty 2

## 2023-09-26 MED ORDER — METRONIDAZOLE 500 MG PO TABS: 500.0000 mg | ORAL_TABLET | Freq: Two times a day (BID) | ORAL | 0 refills | Status: AC

## 2023-09-26 MED ORDER — IOHEXOL 300 MG/ML  SOLN
100.0000 mL | Freq: Once | INTRAMUSCULAR | Status: AC | PRN
  Administered 2023-09-26: 100 mL via INTRAVENOUS

## 2023-09-26 NOTE — ED Triage Notes (Signed)
Pt reports lower abdominal pain that has been progressively getting worse. Hx of fibroids. Pt also reports discharge and different smell to discharge.

## 2023-09-26 NOTE — ED Provider Notes (Signed)
[place] [facility] AT [facility] Provider Note   CSN: [id number] Arrival date & time: 09/26/23  0818     History  Chief Complaint  Patient presents with   Abdominal Pain    Rebecca Bradley is a 42 y.o. female.  Patient here with lower abdominal pain for the last few days.  History of fibroids.  She is having some discharge.  Recent sexual activity where condom fell off.  Patient with pain mostly in the suprapubic and right lower quadrant not as much the left lower quadrant she states.  No nausea or vomiting.  No flank pain.  No real urinary symptoms.  Denies any nausea or vomiting or diarrhea.  Maybe some constipation.  Has had her gallbladder removed in the past.  Nothing makes it worse or better.  History of anxiety.  The history is provided by the patient.       Home Medications Prior to Admission medications   Medication Sig Start Date End Date Taking? Authorizing Provider  doxycycline (VIBRAMYCIN) 100 MG capsule Take 1 capsule (100 mg total) by mouth 2 (two) times daily. 07/20/22   Smoot, Shawn Route, PA-C  famotidine (PEPCID) 20 MG tablet Take 1 tablet (20 mg total) by mouth 2 (two) times daily. 06/25/21   Maxwell Caul, PA-C  hydrOXYzine (ATARAX/VISTARIL) 25 MG tablet Take 1 tablet (25 mg total) by mouth every 6 (six) hours as needed for anxiety. 06/25/21   Maxwell Caul, PA-C  metroNIDAZOLE (FLAGYL) 500 MG tablet Take 1 tablet (500 mg total) by mouth 2 (two) times daily. 09/26/23   Narelle Schoening, DO  ondansetron (ZOFRAN) 4 MG tablet Take 1 tablet (4 mg total) by mouth every 6 (six) hours as needed for nausea or vomiting. 06/29/23   Al Decant, PA-C  fluticasone (FLONASE) 50 MCG/ACT nasal spray Place 2 sprays into both nostrils daily. 08/12/20 10/09/20  Henderly, Britni A, PA-C  ranitidine (ZANTAC) 150 MG tablet Take 1 tablet (150 mg total) by mouth 2 (two) times daily. 01/01/18 07/26/19  Rasch, Harolyn Rutherford, NP      Allergies    Patient has no  known allergies.    Review of Systems   Review of Systems  Physical Exam Updated Vital Signs BP (!) 146/97 (BP Location: Right Arm)   Pulse 80   Temp 98.2 F (36.8 C) (Oral)   Resp 17   Ht 5\' 5"  (1.651 m)   Wt 81.6 kg   LMP 09/06/2023 (Approximate) Comment: negative u-preg 09/26/23  SpO2 100%   BMI 29.95 kg/m  Physical Exam Vitals and nursing note reviewed. Exam conducted with a chaperone present.  Constitutional:      General: She is not in acute distress.    Appearance: She is well-developed.  HENT:     Head: Normocephalic and atraumatic.     Mouth/Throat:     Mouth: Mucous membranes are moist.  Eyes:     Extraocular Movements: Extraocular movements intact.     Conjunctiva/sclera: Conjunctivae normal.     Pupils: Pupils are equal, round, and reactive to light.  Cardiovascular:     Rate and Rhythm: Normal rate and regular rhythm.     Heart sounds: Normal heart sounds. No murmur heard. Pulmonary:     Effort: Pulmonary effort is normal. No respiratory distress.     Breath sounds: Normal breath sounds.  Abdominal:     Palpations: Abdomen is soft.     Tenderness: There is generalized abdominal tenderness.  Genitourinary:  Vagina: Normal.     Cervix: Discharge present. No cervical motion tenderness.     Uterus: Normal.      Adnexa: Right adnexa normal and left adnexa normal.       Right: No mass or tenderness.         Left: No mass or tenderness.    Musculoskeletal:        General: No swelling.     Cervical back: Neck supple.  Skin:    General: Skin is warm and dry.     Capillary Refill: Capillary refill takes less than 2 seconds.  Neurological:     Mental Status: She is alert.  Psychiatric:        Mood and Affect: Mood normal.     ED Results / Procedures / Treatments   Labs (all labs ordered are listed, but only abnormal results are displayed) Labs Reviewed  WET PREP, GENITAL - Abnormal; Notable for the following components:      Result Value   Clue  Cells Wet Prep HPF POC PRESENT (*)    All other components within normal limits  URINALYSIS, ROUTINE W REFLEX MICROSCOPIC - Abnormal; Notable for the following components:   Color, Urine STRAW (*)    APPearance HAZY (*)    All other components within normal limits  CBC WITH DIFFERENTIAL/PLATELET - Abnormal; Notable for the following components:   WBC 3.3 (*)    Hemoglobin 10.0 (*)    HCT 32.6 (*)    MCV 78.6 (*)    MCH 24.1 (*)    RDW 19.1 (*)    Neutro Abs 1.5 (*)    All other components within normal limits  PREGNANCY, URINE  COMPREHENSIVE METABOLIC PANEL  LIPASE, BLOOD  GC/CHLAMYDIA PROBE AMP ([place]) NOT AT [facility]    EKG None  Radiology CT ABDOMEN PELVIS W CONTRAST  Result Date: 09/26/2023 CLINICAL DATA:  Worsening right lower quadrant pain. Uterine fibroids. EXAM: CT ABDOMEN AND PELVIS WITH CONTRAST TECHNIQUE: Multidetector CT imaging of the abdomen and pelvis was performed using the standard protocol following bolus administration of intravenous contrast. RADIATION DOSE REDUCTION: This exam was performed according to the departmental dose-optimization program which includes automated exposure control, adjustment of the mA and/or kV according to patient size and/or use of iterative reconstruction technique. CONTRAST:  OMNIPAQUE IOHEXOL 300 MG/ML  SOLN COMPARISON:  None Available. FINDINGS: Lower Chest: No acute findings. Hepatobiliary: No suspicious hepatic masses identified. Prior cholecystectomy. No evidence of biliary obstruction. Pancreas:  No mass or inflammatory changes. Spleen: Within normal limits in size and appearance. Adrenals/Urinary Tract: No suspicious masses identified. No evidence of ureteral calculi or hydronephrosis. Stomach/Bowel: No evidence of obstruction, inflammatory process or abnormal fluid collections. Normal appendix visualized. Vascular/Lymphatic: No pathologically enlarged lymph nodes. No acute vascular findings. Reproductive: Small uterine  fibroid seen in the right anterior corpus measuring 2.4 cm. Adnexal regions are unremarkable. Other:  None. Musculoskeletal:  No suspicious bone lesions identified. IMPRESSION: No evidence of appendicitis or other acute findings. Small uterine fibroid. Electronically Signed   By: Danae Orleans M.D.   On: 09/26/2023 11:38   US PELVIC COMPLETE W TRANSVAGINAL AND TORSION R/O  Result Date: 09/26/2023 CLINICAL DATA:  Pelvic pain.  Vaginal discharge EXAM: TRANSABDOMINAL AND TRANSVAGINAL ULTRASOUND OF PELVIS DOPPLER ULTRASOUND OF OVARIES TECHNIQUE: Both transabdominal and transvaginal ultrasound examinations of the pelvis were performed. Transabdominal technique was performed for global imaging of the pelvis including uterus, ovaries, adnexal regions, and pelvic cul-de-sac. It was necessary to proceed  with endovaginal exam following the transabdominal exam to visualize the endometrium and adnexa. Color and duplex Doppler ultrasound was utilized to evaluate blood flow to the ovaries. COMPARISON:  Ultrasound 11/22/2017 FINDINGS: Uterus Measurements: 8.8 x 4.7 x 5.6 cm = volume: 121 mL. Central heterogeneous area measuring 2.4 cm consistent with a fibroid. Endometrium Thickness: 10 mm.  No focal abnormality visualized. Right ovary Measurements: 3.7 x 2.1 x 2.1 cm = volume: 8.8 mL. Normal appearance/no adnexal mass. Left ovary Measurements: 2.0 x 1.7 x 3.3 cm = volume: 6.1 mL. Normal appearance/no adnexal mass. Pulsed Doppler evaluation of both ovaries demonstrates normal low-resistance arterial and venous waveforms. Other findings No abnormal free fluid. IMPRESSION: Central uterine fibroid measuring 2.2 cm.  No free fluid. Electronically Signed   By: Karen Kays M.D.   On: 09/26/2023 11:34    Procedures Procedures    Medications Ordered in ED Medications  acetaminophen (TYLENOL) tablet 650 mg (650 mg Oral Given 09/26/23 0856)  iohexol (OMNIPAQUE) 300 MG/ML solution 100 mL (100 mLs Intravenous Contrast Given  09/26/23 0941)    ED Course/ Medical Decision Making/ A&P                                 Medical Decision Making Amount and/or Complexity of Data Reviewed Labs: ordered. Radiology: ordered.  Risk OTC drugs. Prescription drug management.   Rebecca Bradley is here with abdominal pain.  Vaginal discharge.  Normal vitals.  No fever.  Differential is wide but could be BV or STD, not much cervical lotion tenderness on exam but a fair amount of discharge with nonfriable cervix on exam.  She is tender also on the right lower quadrant.  Seems like it could be STD related but could be torsion or appendicitis or UTI.  Overall we will get a CT scan abdomen pelvis and pelvic ultrasound and check labs including CBC, CMP, lipase, STD testing including gonorrhea chlamydia and trichomonas as well as urinalysis and pregnancy test.  Per my review and interpretation of labs no significant anemia or electrolyte abnormality kidney injury or leukocytosis.  Wet prep positive for bacterial vaginosis.  Trichomonas negative.  No evidence of UTI.  Per radiology report there is no evidence of appendicitis or other acute intra-abdominal finding.  She does have a small uterine fibroid that was also seen on pelvic ultrasound.  There is no evidence of abscess or torsion or other concerning features on pelvic ultrasound.  Overall we will treat for bacterial vaginosis.  Recommend follow-up with primary care doctor and OB/GYN.  Discharged in good condition.  STD testing still pending.  Told abstain from sexual activity until results are back.  This chart was dictated using voice recognition software.  Despite best efforts to proofread,  errors can occur which can change the documentation meaning.         Final Clinical Impression(s) / ED Diagnoses Final diagnoses:  BV (bacterial vaginosis)  Uterine leiomyoma, unspecified location    Rx / DC Orders ED Discharge Orders          Ordered    metroNIDAZOLE (FLAGYL) 500  MG tablet  2 times daily        09/26/23 1146              [place], DO 09/26/23 1148

## 2023-09-26 NOTE — Discharge Instructions (Signed)
Take antibiotics as prescribed.  Follow-up with your OB/GYN or primary care.  Do not mix your antibiotic with alcohol as it will make you sick.  Abstain from sexual activity until you have your gonorrhea and Chlamydia testing back.

## 2023-09-27 LAB — GC/CHLAMYDIA PROBE AMP (~~LOC~~) NOT AT ARMC
Chlamydia: NEGATIVE
Comment: NEGATIVE
Comment: NORMAL
Neisseria Gonorrhea: NEGATIVE

## 2024-02-15 ENCOUNTER — Other Ambulatory Visit: Payer: Self-pay

## 2024-02-15 ENCOUNTER — Emergency Department (HOSPITAL_BASED_OUTPATIENT_CLINIC_OR_DEPARTMENT_OTHER)
Admission: EM | Admit: 2024-02-15 | Discharge: 2024-02-15 | Disposition: A | Discharge status: Home / Self Care | Attending: Emergency Medicine | Admitting: Emergency Medicine

## 2024-02-15 ENCOUNTER — Emergency Department (HOSPITAL_BASED_OUTPATIENT_CLINIC_OR_DEPARTMENT_OTHER)

## 2024-02-15 DIAGNOSIS — U071 COVID-19: Secondary | ICD-10-CM | POA: Insufficient documentation

## 2024-02-15 DIAGNOSIS — R051 Acute cough: Secondary | ICD-10-CM | POA: Diagnosis present

## 2024-02-15 LAB — RESP PANEL BY RT-PCR (RSV, FLU A&B, COVID)  RVPGX2
Influenza A by PCR: NEGATIVE
Influenza B by PCR: NEGATIVE
Resp Syncytial Virus by PCR: NEGATIVE
SARS Coronavirus 2 by RT PCR: POSITIVE — AB

## 2024-02-15 MED ORDER — BENZONATATE 100 MG PO CAPS: 100.0000 mg | ORAL_CAPSULE | Freq: Three times a day (TID) | ORAL | 0 refills | Status: DC | PRN

## 2024-02-15 MED ORDER — BENZONATATE 100 MG PO CAPS: 100.0000 mg | ORAL_CAPSULE | Freq: Three times a day (TID) | ORAL | 0 refills | Status: AC | PRN

## 2024-02-15 MED ORDER — IBUPROFEN 600 MG PO TABS: 600.0000 mg | ORAL_TABLET | Freq: Four times a day (QID) | ORAL | 0 refills | Status: AC | PRN

## 2024-02-15 MED ORDER — CETIRIZINE-PSEUDOEPHEDRINE ER 5-120 MG PO TB12: 1.0000 | ORAL_TABLET | Freq: Every day | ORAL | 0 refills | Status: DC | PRN

## 2024-02-15 MED ORDER — CETIRIZINE-PSEUDOEPHEDRINE ER 5-120 MG PO TB12: 1.0000 | ORAL_TABLET | Freq: Every day | ORAL | 0 refills | Status: AC | PRN

## 2024-02-15 MED ORDER — IBUPROFEN 600 MG PO TABS: 600.0000 mg | ORAL_TABLET | Freq: Four times a day (QID) | ORAL | 0 refills | Status: DC | PRN

## 2024-02-15 MED ORDER — MOLNUPIRAVIR EUA 200MG CAPSULE: 4.0000 | ORAL_CAPSULE | Freq: Two times a day (BID) | ORAL | 0 refills | Status: AC

## 2024-02-15 NOTE — ED Notes (Signed)
 Pt. Reports she has had some ear discomfort with the URI symptoms.

## 2024-02-15 NOTE — ED Provider Notes (Signed)
 [place] [facility] Provider Note   CSN: [id number] Arrival date & time: 02/15/24  1901     History  Chief Complaint  Patient presents with   flu like symptoms   Cough   URI    Rebecca Bradley is a 43 y.o. female.   Cough URI Presenting symptoms: cough     43 year old female presents emergency department with complaints of cough, congestion.  States she has had symptoms for the past week or so.  Reports both of her children ill with similar symptoms over the past week.  Denies any fever, chills, difficulty breathing, abdominal pain, nausea, vomiting.  Has been trying over-the-counter medications with some improvement of symptoms.  Presents emergency department for further assessment.  Past medical history significant for anxiety, pregnancy  Home Medications Prior to Admission medications   Medication Sig Start Date End Date Taking? Authorizing Provider  molnupiravir EUA (LAGEVRIO) 200 mg CAPS capsule Take 4 capsules (800 mg total) by mouth 2 (two) times daily for 5 days. 02/15/24 02/20/24 Yes Sherian Maroon A, PA  benzonatate (TESSALON) 100 MG capsule Take 1 capsule (100 mg total) by mouth 3 (three) times daily as needed. 02/15/24   Peter Garter, PA  cetirizine-pseudoephedrine (ZYRTEC-D) 5-120 MG tablet Take 1 tablet by mouth daily as needed for allergies or rhinitis. 02/15/24   Peter Garter, PA  doxycycline (VIBRAMYCIN) 100 MG capsule Take 1 capsule (100 mg total) by mouth 2 (two) times daily. 07/20/22   Smoot, Shawn Route, PA-C  famotidine (PEPCID) 20 MG tablet Take 1 tablet (20 mg total) by mouth 2 (two) times daily. 06/25/21   Maxwell Caul, PA-C  hydrOXYzine (ATARAX/VISTARIL) 25 MG tablet Take 1 tablet (25 mg total) by mouth every 6 (six) hours as needed for anxiety. 06/25/21   Maxwell Caul, PA-C  ibuprofen (ADVIL) 600 MG tablet Take 1 tablet (600 mg total) by mouth every 6 (six) hours as needed. 02/15/24   Peter Garter, PA   metroNIDAZOLE (FLAGYL) 500 MG tablet Take 1 tablet (500 mg total) by mouth 2 (two) times daily. 09/26/23   Curatolo, Adam, DO  ondansetron (ZOFRAN) 4 MG tablet Take 1 tablet (4 mg total) by mouth every 6 (six) hours as needed for nausea or vomiting. 06/29/23   Al Decant, PA-C  fluticasone (FLONASE) 50 MCG/ACT nasal spray Place 2 sprays into both nostrils daily. 08/12/20 10/09/20  Henderly, Britni A, PA-C  ranitidine (ZANTAC) 150 MG tablet Take 1 tablet (150 mg total) by mouth 2 (two) times daily. 01/01/18 07/26/19  Rasch, Harolyn Rutherford, NP      Allergies    Patient has no known allergies.    Review of Systems   Review of Systems  Respiratory:  Positive for cough.   All other systems reviewed and are negative.   Physical Exam Updated Vital Signs BP (!) 149/97 (BP Location: Right Arm)   Pulse 68   Temp 99.1 F (37.3 C)   Resp 16   Ht 5\' 5"  (1.651 m)   Wt 85.7 kg   LMP 12/28/2023   SpO2 100%   BMI 31.45 kg/m  Physical Exam Vitals and nursing note reviewed.  Constitutional:      General: She is not in acute distress.    Appearance: She is well-developed.  HENT:     Head: Normocephalic and atraumatic.  Eyes:     Conjunctiva/sclera: Conjunctivae normal.  Cardiovascular:     Rate and Rhythm: Normal rate  and regular rhythm.     Heart sounds: No murmur heard. Pulmonary:     Effort: Pulmonary effort is normal. No respiratory distress.     Breath sounds: Normal breath sounds. No wheezing or rales.  Abdominal:     Palpations: Abdomen is soft.     Tenderness: There is no abdominal tenderness. There is no right CVA tenderness, left CVA tenderness or guarding.  Musculoskeletal:        General: No swelling.     Cervical back: Neck supple.  Skin:    General: Skin is warm and dry.     Capillary Refill: Capillary refill takes less than 2 seconds.  Neurological:     Mental Status: She is alert.  Psychiatric:        Mood and Affect: Mood normal.     ED Results / Procedures  / Treatments   Labs (all labs ordered are listed, but only abnormal results are displayed) Labs Reviewed  RESP PANEL BY RT-PCR (RSV, FLU A&B, COVID)  [id number] - Abnormal; Notable for the following components:      Result Value   SARS Coronavirus 2 by RT PCR POSITIVE (*)    All other components within normal limits    EKG None  Radiology No results found.  Procedures Procedures    Medications Ordered in ED Medications - No data to display  ED Course/ Medical Decision Making/ A&P                                 Medical Decision Making Amount and/or Complexity of Data Reviewed Radiology: ordered.  Risk OTC drugs. Prescription drug management.   This patient presents to the ED for concern of cough, congestion, this involves an extensive number of treatment options, and is a complaint that carries with it a high risk of complications and morbidity.  The differential diagnosis includes COVID, flu, RSV, pneumonia, asthma, COPD, other   Co morbidities that complicate the patient evaluation  See HPI   Additional history obtained:  Additional history obtained from EMR External records from outside source obtained and reviewed including hospital records   Lab Tests:  I Ordered, and personally interpreted labs.  The pertinent results include: Viral testing positive for COVID   Imaging Studies ordered:  I ordered imaging studies including chest x-ray I independently visualized and interpreted imaging which showed no acute cardiopulmonary abnormality. I agree with the radiologist interpretation   Cardiac Monitoring: / EKG:  The patient was maintained on a cardiac monitor.  I personally viewed and interpreted the cardiac monitored which showed an underlying rhythm of: sinus rhythm   Consultations Obtained:  N/a   Problem List / ED Course / Critical interventions / Medication management  COVID, cough, congestion Reevaluation of the patient showed that the  patient stayed the same I have reviewed the patients home medicines and have made adjustments as needed   Social Determinants of Health:  Former cigarette use.  Denies illicit drug use.   Test / Admission - Considered:  COVID, cough, congestion Vitals signs significant for hyper blood pressure 149/97. Otherwise within normal range and stable throughout visit. Laboratory/imaging studies significant for: See above 43 year old female presents emergency department with complaints of cough, congestion.  States she has had symptoms for the past week or so.  Reports both of her children ill with similar symptoms over the past week.  Denies any fever, chills, difficulty breathing, abdominal pain, nausea,  vomiting.  Has been trying over-the-counter medications with some improvement of symptoms.  Presents emergency department for further assessment. Exam, lungs clear to auscultation bilaterally.  Chest x-ray imaging was obtained given patient's prolonged cough which was negative for pneumonia or other acute cardiopulmonary abnormality.  Patient's viral testing positive for COVID which is most likely causing symptoms.  Will recommend symptomatic therapy as described in AVS and follow-up with primary care in the outpatient setting for reassessment.  Treatment plan discussed with patient and she acknowledged understanding was agreeable to said plan.  Patient overall well-appearing and afebrile in no acute distress. Worrisome signs and symptoms were discussed with the patient, and the patient acknowledged understanding to return to the ED if noticed. Patient was stable upon discharge.          Final Clinical Impression(s) / ED Diagnoses Final diagnoses:  COVID  Acute cough    Rx / DC Orders ED Discharge Orders     None         Peter Garter, [place] 02/15/24 2327    Virgina Norfolk, DO 02/16/24 1515

## 2024-02-15 NOTE — ED Triage Notes (Signed)
 Pt. Reports she has had cough and runny nose with anxiety.  Pt. Reports this started a week ago and she started meds OTC cold meds.

## 2024-02-15 NOTE — Discharge Instructions (Signed)
 As discussed, your chest x-ray did not show any obvious pneumonia.  Your viral testing was positive for COVID which is most likely causing symptoms.  Your outside of window for antivirals but will recommend treatment of your symptoms at home with continued allergy medicine with decongestion.  Will also send in cough medicine to use as needed.  Continue take Tylenol/Motrin for any pain/fever.  Recommend follow-up with your primary care for reassessment.  Please not hesitate to return to emergency department if the worrisome signs and symptoms we discussed become apparent.
